# Patient Record
Sex: Male | Born: 1935 | Race: White | Hispanic: No | Smoking: Former smoker
Health system: Southern US, Community
[De-identification: ages and names within clinical notes are randomized; demographics above are authoritative.]

## PROBLEM LIST (undated history)

## (undated) DIAGNOSIS — I219 Acute myocardial infarction, unspecified: Secondary | ICD-10-CM

## (undated) DIAGNOSIS — I4891 Unspecified atrial fibrillation: Secondary | ICD-10-CM

## (undated) DIAGNOSIS — N183 Chronic kidney disease, stage 3 (moderate): Secondary | ICD-10-CM

## (undated) DIAGNOSIS — K224 Dyskinesia of esophagus: Secondary | ICD-10-CM

## (undated) DIAGNOSIS — I2510 Atherosclerotic heart disease of native coronary artery without angina pectoris: Secondary | ICD-10-CM

## (undated) DIAGNOSIS — I499 Cardiac arrhythmia, unspecified: Secondary | ICD-10-CM

## (undated) DIAGNOSIS — J449 Chronic obstructive pulmonary disease, unspecified: Secondary | ICD-10-CM

## (undated) DIAGNOSIS — C801 Malignant (primary) neoplasm, unspecified: Secondary | ICD-10-CM

## (undated) DIAGNOSIS — L600 Ingrowing nail: Secondary | ICD-10-CM

## (undated) DIAGNOSIS — R10A0 Flank pain, unspecified side: Secondary | ICD-10-CM

## (undated) DIAGNOSIS — N1831 Chronic kidney disease, stage 3a: Secondary | ICD-10-CM

## (undated) DIAGNOSIS — R32 Unspecified urinary incontinence: Secondary | ICD-10-CM

## (undated) DIAGNOSIS — I6529 Occlusion and stenosis of unspecified carotid artery: Secondary | ICD-10-CM

## (undated) DIAGNOSIS — I495 Sick sinus syndrome: Secondary | ICD-10-CM

## (undated) DIAGNOSIS — I358 Other nonrheumatic aortic valve disorders: Secondary | ICD-10-CM

## (undated) DIAGNOSIS — E7800 Pure hypercholesterolemia, unspecified: Secondary | ICD-10-CM

## (undated) DIAGNOSIS — R109 Unspecified abdominal pain: Secondary | ICD-10-CM

## (undated) DIAGNOSIS — R1310 Dysphagia, unspecified: Secondary | ICD-10-CM

## (undated) DIAGNOSIS — I10 Essential (primary) hypertension: Secondary | ICD-10-CM

## (undated) DIAGNOSIS — K635 Polyp of colon: Secondary | ICD-10-CM

## (undated) DIAGNOSIS — R413 Other amnesia: Secondary | ICD-10-CM

## (undated) HISTORY — DX: Other amnesia: R41.3

## (undated) HISTORY — DX: Chronic kidney disease, stage 3 (moderate): N18.3

## (undated) HISTORY — DX: Chronic kidney disease, stage 3a: N18.31

## (undated) HISTORY — DX: Other nonrheumatic aortic valve disorders: I35.8

## (undated) HISTORY — DX: Polyp of colon: K63.5

## (undated) HISTORY — DX: Ingrowing nail: L60.0

## (undated) HISTORY — DX: Unspecified abdominal pain: R10.9

## (undated) HISTORY — DX: Dyskinesia of esophagus: K22.4

## (undated) HISTORY — DX: Atherosclerotic heart disease of native coronary artery without angina pectoris: I25.10

## (undated) HISTORY — PX: CARDIAC SURGERY: SHX584

## (undated) HISTORY — PX: INGUINAL HERNIA REPAIR: SUR1180

## (undated) HISTORY — DX: Sick sinus syndrome: I49.5

## (undated) HISTORY — DX: Occlusion and stenosis of unspecified carotid artery: I65.29

## (undated) HISTORY — DX: Dysphagia, unspecified: R13.10

## (undated) HISTORY — DX: Unspecified urinary incontinence: R32

## (undated) HISTORY — DX: Flank pain, unspecified side: R10.A0

## (undated) HISTORY — PX: CORONARY ARTERY BYPASS GRAFT: SHX141

## (undated) HISTORY — PX: PTCA: SHX146

## (undated) HISTORY — PX: APPENDECTOMY: SHX54

## (undated) HISTORY — DX: Cardiac arrhythmia, unspecified: I49.9

## (undated) HISTORY — DX: Unspecified atrial fibrillation: I48.91

## (undated) HISTORY — PX: OTHER SURGICAL HISTORY: SHX169

---
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
## 1996-11-30 HISTORY — PX: REFRACTIVE SURGERY: SHX103

## 1996-12-31 HISTORY — PX: PACEMAKER INSERTION: SHX728

## 1998-09-19 ENCOUNTER — Encounter: Payer: Self-pay | Admitting: Gastroenterology

## 1998-09-19 ENCOUNTER — Ambulatory Visit (HOSPITAL_COMMUNITY): Admission: RE | Admit: 1998-09-19 | Discharge: 1998-09-19 | Payer: Self-pay | Admitting: Gastroenterology

## 2001-05-13 ENCOUNTER — Encounter (INDEPENDENT_AMBULATORY_CARE_PROVIDER_SITE_OTHER): Payer: Self-pay | Admitting: Specialist

## 2001-05-13 ENCOUNTER — Ambulatory Visit (HOSPITAL_COMMUNITY): Admission: RE | Admit: 2001-05-13 | Discharge: 2001-05-13 | Payer: Self-pay | Admitting: Gastroenterology

## 2002-05-11 ENCOUNTER — Ambulatory Visit (HOSPITAL_COMMUNITY): Admission: RE | Admit: 2002-05-11 | Discharge: 2002-05-11 | Payer: Self-pay | Admitting: Ophthalmology

## 2003-03-26 ENCOUNTER — Ambulatory Visit (HOSPITAL_COMMUNITY): Admission: RE | Admit: 2003-03-26 | Discharge: 2003-03-26 | Payer: Self-pay | Admitting: Internal Medicine

## 2003-10-25 ENCOUNTER — Emergency Department (HOSPITAL_COMMUNITY): Admission: EM | Admit: 2003-10-25 | Discharge: 2003-10-25 | Payer: Self-pay | Admitting: Emergency Medicine

## 2005-03-25 ENCOUNTER — Ambulatory Visit (HOSPITAL_COMMUNITY): Admission: RE | Admit: 2005-03-25 | Discharge: 2005-03-25 | Payer: Self-pay | Admitting: Internal Medicine

## 2005-08-09 ENCOUNTER — Emergency Department (HOSPITAL_COMMUNITY): Admission: EM | Admit: 2005-08-09 | Discharge: 2005-08-09 | Payer: Self-pay | Admitting: Emergency Medicine

## 2006-01-22 ENCOUNTER — Encounter: Admission: RE | Admit: 2006-01-22 | Discharge: 2006-01-22 | Payer: Self-pay | Admitting: Internal Medicine

## 2006-05-20 ENCOUNTER — Ambulatory Visit (HOSPITAL_COMMUNITY): Admission: RE | Admit: 2006-05-20 | Discharge: 2006-05-20 | Payer: Self-pay | Admitting: Internal Medicine

## 2006-05-20 ENCOUNTER — Encounter: Payer: Self-pay | Admitting: Vascular Surgery

## 2007-02-23 ENCOUNTER — Observation Stay (HOSPITAL_COMMUNITY): Admission: EM | Admit: 2007-02-23 | Discharge: 2007-02-24 | Payer: Self-pay | Admitting: Emergency Medicine

## 2007-02-24 HISTORY — PX: PACEMAKER GENERATOR CHANGE: SHX5998

## 2007-11-02 ENCOUNTER — Ambulatory Visit: Payer: Self-pay | Admitting: Surgery

## 2007-11-02 ENCOUNTER — Encounter (INDEPENDENT_AMBULATORY_CARE_PROVIDER_SITE_OTHER): Payer: Self-pay | Admitting: Internal Medicine

## 2007-11-02 ENCOUNTER — Ambulatory Visit (HOSPITAL_COMMUNITY): Admission: RE | Admit: 2007-11-02 | Discharge: 2007-11-02 | Payer: Self-pay | Admitting: Internal Medicine

## 2008-12-02 ENCOUNTER — Emergency Department (HOSPITAL_COMMUNITY): Admission: EM | Admit: 2008-12-02 | Discharge: 2008-12-02 | Payer: Self-pay | Admitting: Emergency Medicine

## 2009-11-30 DIAGNOSIS — L600 Ingrowing nail: Secondary | ICD-10-CM

## 2009-11-30 HISTORY — DX: Ingrowing nail: L60.0

## 2010-09-10 ENCOUNTER — Emergency Department (HOSPITAL_COMMUNITY): Admission: EM | Admit: 2010-09-10 | Discharge: 2010-09-10 | Payer: Self-pay | Admitting: Emergency Medicine

## 2011-02-04 ENCOUNTER — Other Ambulatory Visit: Payer: Self-pay | Admitting: Gastroenterology

## 2011-02-04 ENCOUNTER — Other Ambulatory Visit (HOSPITAL_COMMUNITY): Payer: Self-pay | Admitting: Gastroenterology

## 2011-02-09 ENCOUNTER — Ambulatory Visit (HOSPITAL_COMMUNITY)
Admission: RE | Admit: 2011-02-09 | Discharge: 2011-02-09 | Disposition: A | Discharge status: Home / Self Care | Payer: Medicare PPO | Source: Ambulatory Visit | Attending: Gastroenterology | Admitting: Gastroenterology

## 2011-02-09 DIAGNOSIS — K449 Diaphragmatic hernia without obstruction or gangrene: Secondary | ICD-10-CM | POA: Insufficient documentation

## 2011-02-09 DIAGNOSIS — R1310 Dysphagia, unspecified: Secondary | ICD-10-CM | POA: Insufficient documentation

## 2011-02-09 DIAGNOSIS — K219 Gastro-esophageal reflux disease without esophagitis: Secondary | ICD-10-CM | POA: Insufficient documentation

## 2011-02-12 LAB — COMPREHENSIVE METABOLIC PANEL
ALT: 18 U/L (ref 0–53)
AST: 23 U/L (ref 0–37)
Alkaline Phosphatase: 85 U/L (ref 39–117)
CO2: 27 mEq/L (ref 19–32)
GFR calc Af Amer: 54 mL/min — ABNORMAL LOW (ref >60)
GFR calc non Af Amer: 45 mL/min — ABNORMAL LOW (ref >60)
Glucose, Bld: 100 mg/dL — ABNORMAL HIGH (ref 70–99)
Potassium: 4.1 mEq/L (ref 3.5–5.1)
Sodium: 141 mEq/L (ref 135–145)

## 2011-02-12 LAB — CBC
HCT: 40.4 % (ref 39.0–52.0)
Hemoglobin: 13.5 g/dL (ref 13.0–17.0)
MCH: 30.4 pg (ref 26.0–34.0)
MCHC: 33.4 g/dL (ref 30.0–36.0)
MCV: 91 fL (ref 78.0–100.0)
RBC: 4.44 MIL/uL (ref 4.22–5.81)

## 2011-02-12 LAB — TROPONIN I
Troponin I: 0.01 ng/mL (ref 0.00–0.06)
Troponin I: 0.02 ng/mL (ref 0.00–0.06)

## 2011-02-12 LAB — CK TOTAL AND CKMB (NOT AT ARMC): CK, MB: 3 ng/mL (ref 0.3–4.0)

## 2011-02-26 ENCOUNTER — Ambulatory Visit (HOSPITAL_COMMUNITY)
Admission: RE | Admit: 2011-02-26 | Discharge: 2011-02-26 | Disposition: A | Discharge status: Home / Self Care | Payer: Medicare PPO | Source: Ambulatory Visit | Attending: Gastroenterology | Admitting: Gastroenterology

## 2011-02-26 DIAGNOSIS — K2289 Other specified disease of esophagus: Secondary | ICD-10-CM | POA: Insufficient documentation

## 2011-02-26 DIAGNOSIS — Z79899 Other long term (current) drug therapy: Secondary | ICD-10-CM | POA: Insufficient documentation

## 2011-02-26 DIAGNOSIS — R1310 Dysphagia, unspecified: Secondary | ICD-10-CM | POA: Insufficient documentation

## 2011-02-26 DIAGNOSIS — K228 Other specified diseases of esophagus: Secondary | ICD-10-CM | POA: Insufficient documentation

## 2011-02-26 DIAGNOSIS — K219 Gastro-esophageal reflux disease without esophagitis: Secondary | ICD-10-CM | POA: Insufficient documentation

## 2011-02-26 DIAGNOSIS — K449 Diaphragmatic hernia without obstruction or gangrene: Secondary | ICD-10-CM | POA: Insufficient documentation

## 2011-03-10 NOTE — Op Note (Signed)
  NAMEMarland [name]  KEIDRICK, MURTY NO.:  [id number]  MEDICAL RECORD NO.:  [id number]           PATIENT TYPE:  O  LOCATION:  WLEN                         FACILITY:  [facility]  PHYSICIAN:  Danise Edge, M.D.   DATE OF BIRTH:  Jan 02, 1917  DATE OF PROCEDURE:  02/26/2011 DATE OF DISCHARGE:                              OPERATIVE REPORT   PROCEDURE:  Esophagogastroduodenoscopy.  REFERRING PHYSICIAN:  Theressa Millard, M.D.  HISTORY:  Mr. John [name] is a 77 year old male born [birth year]. Mr. Kissoon has chronic gastroesophageal reflux manifested by heartburn. He is currently taking Nexium 40 mg each morning.  He reports breakthrough nocturnal heartburn.  The patient underwent a barium esophagram with barium tablet to evaluate his chronic intermittent solid food esophageal dysphagia without odynophagia.  Barium esophagram was normal except for gastroesophageal reflux.  ENDOSCOPIST:  Danise Edge, M.D.  PREMEDICATIONS: 1. Fentanyl 25 mcg. 2. Versed 3 mg.  DESCRIPTION OF PROCEDURE:  After obtaining informed consent, the patient was placed in the left lateral decubitus position.  The Pentax gastroscope was passed through the posterior hypopharynx into the proximal esophagus without difficulty.  The hypopharynx, larynx and vocal cords appeared normal.  ESOPHAGOSCOPY:  The proximal, mid, and lower segments of the esophageal mucosa appeared normal except for an isolated circular superficial erosion at the esophagogastric junction which was noted at approximately 40 cm from the incisor teeth.  There was no endoscopic evidence for the presence of Barrett's esophagus, esophageal stricture formation, or esophageal obstruction.  GASTROSCOPY:  There is a moderate-sized hiatal hernia.  Retroflex view of the gastric cardia and fundus was normal.  The diaphragmatic hiatus was quite patulous.  The gastric body, antrum and pylorus appeared normal.  DUODENOSCOPY:  The duodenal bulb  and descending duodenum appeared normal.  ASSESSMENT:  Chronic gastroesophageal reflux associated with breakthrough nocturnal heartburn associated with a moderate-sized hiatal hernia and an isolated erosion at the esophagogastric junction but no evidence of esophageal stricture formation that would warrant esophageal dilation.  RECOMMENDATIONS:  I will switch the patient from Nexium 40 mg each morning to omeprazole 20 mg before breakfast and 20 mg before supper.  I suspect Mr. Herendeen has esophageal dysmotility associated with his uncontrolled gastroesophageal reflux.          ______________________________ Danise Edge, M.D.     MJ/MEDQ  D:  02/26/2011  T:  02/26/2011  Job:  [id number]  cc:   Theressa Millard, M.D. Fax: [phone]  Electronically Signed by Danise Edge M.D. on 03/10/2011 12:13:45 PM

## 2011-03-16 LAB — POCT I-STAT, CHEM 8
BUN: 19 mg/dL (ref 6–23)
Calcium, Ion: 1.18 mmol/L (ref 1.12–1.32)
Creatinine, Ser: 1.3 mg/dL (ref 0.4–1.5)
Hemoglobin: 15 g/dL (ref 13.0–17.0)
TCO2: 24 mmol/L (ref 0–100)

## 2011-03-16 LAB — PROTIME-INR: Prothrombin Time: 12.9 seconds (ref 11.6–15.2)

## 2011-03-16 LAB — T4, FREE: Free T4: 1.24 ng/dL (ref 0.89–1.80)

## 2011-03-16 LAB — MAGNESIUM: Magnesium: 2.2 mg/dL (ref 1.5–2.5)

## 2011-04-17 NOTE — Op Note (Signed)
NAME:  John, [name] NO.:  [id number]   MEDICAL RECORD NO.:  [id number]          PATIENT TYPE:  INP   LOCATION:  6529                         FACILITY:  [facility]   PHYSICIAN:  Elmore Guise., M.D.DATE OF BIRTH:  Dec 30, 1916   DATE OF PROCEDURE:  02/24/2007  DATE OF DISCHARGE:                               OPERATIVE REPORT   INDICATION FOR PROCEDURE:  Elective change out for generator (battery at  elective replacement interval)   PROCEDURE DESCRIPTION:  The patient brought to the cardiac cath lab  after appropriate informed consent.  He was prepped and draped in  sterile fashion.  Approximately 30 mL of 1% lidocaine was used for local  anesthesia.  A 2 inch incision was made over the left deltopectoral  region over the old retained generator.  The generator was then exposed  and removed from the pocket.  The old device was disconnected from the  leads.  The atrial lead is a Medtronic [id number] - 52 cm, serial number  [id number] implanted January 09, 1997. The following measurements were  obtained.  P-waves measured 6.8 mV with impedance 542 ohms, threshold  0.6 volts at 0.5 milliseconds, current was 1.6 mA.  The ventricular lead  was then checked. Ventricular lead is a Medtronic [id number] 58 cm, serial  number [id number] implanted to January 09, 1997. Following measurements  were obtained.  R waves measured 22.9 mV, impedance of 708 ohms,  threshold 0.2 volts at 0.5 milliseconds, a current of 0.3 mA. The atrial  and ventricular leads were then identified. The pocket was then  copiously irrigated with Kanamycin solution.  Pocket revision was then  done on the inferior border to allow appropriate position of the new  generator.  The new generator was then placed in the pocket and sewn  into place. Pocket was then closed in three continuous layers with 2-0  followed by 2-0 followed by 4-0 Vicryl suture.  The patient tolerated  the procedure well with no apparent  complications.      Elmore Guise., M.D.  Electronically Signed     TWK/MEDQ  D:  02/24/2007  T:  02/24/2007  Job:  [id number]   cc:   Lyn Records, M.D.

## 2011-04-17 NOTE — Discharge Summary (Signed)
John [name], MESSLER NO.:  [id number]   MEDICAL RECORD NO.:  [id number]          PATIENT TYPE:  INP   LOCATION:  6529                         FACILITY:  [facility]   PHYSICIAN:  Guy Franco, P.A.       DATE OF BIRTH:  Apr 06, 1917   DATE OF ADMISSION:  02/23/2007  DATE OF DISCHARGE:  02/24/2007                               DISCHARGE SUMMARY   DISCHARGE DIAGNOSES:  1. Dizziness, resolved.  2. History of sick sinus syndrome.  Initially pacemaker placement in      1998.  Last interrogation in the office in February of 2008.      Showed ERI, battery change-out during this admission.  3. Right bundle-branch block.  4. Coronary artery disease.  History of coronary artery bypass      grafting in 1994 by Dr. Irving Shows.  5. Chronic obstructive pulmonary disease.  6. Hypertension.  7. Aortic valve sclerosis, systolic murmur.  8. Hyperlipidemia.  9. Long-term medication use.   Mr. Chalker is an 77 year old male patient who presented to [facility] with dizziness.  He had known ERI with his current pacemaker.  Was scheduled for a change-out in the near future.  Because of symptoms  and the fact that we knew that his device needed changing out, we went  ahead and had that performed.  The change-out was performed by Dr. Lady Deutscher on February 24, 2007.  The procedure was performed and went well.  The device atrial lead is a Medtronic lead, and the ventricular lead is  a Medtronic lead as well.  The procedure was performed without  difficulty.  The patient was discharged to home later that day, without  problem.   Lab studies during the patient's hospital stay include a white count of  8.7, hemoglobin 13.1, hematocrit 38.5, platelets 175, sodium 141,  potassium 4.1, BUN 11, creatinine 1.11.  CK-MB and troponins are  negative.  TSH 3.374.   Please note that the patient did have a tooth that was questionably  abscessed.  He had an appointment to see his dentist  apparently on the  day of his battery change-out.  I have discussed this with Dr. Lady Deutscher, and we opted to proceed with the change-out.  Antibiotics were  given during this procedure.   He was instructed to follow up with his dentist the following day.   DISCHARGE MEDICATIONS:  1. Amoxicillin 500 mg 1 p.o. b.i.d. for his tooth.  2. He is to continue aspirin 325 mg a day.  3. Caduet 5/10 one tablet daily.  4. Detrol 4 mg a day.  5. Digoxin daily, as prior to admission.  6. Toprol 50 mg a day.  7. Nexium 40 mg a day.  8. Spiriva daily.  9. Sublingual nitroglycerin p.r.n. chest pain.  10.Darvocet p.r.n.   Again, see his dentist tomorrow.  Follow up with Dr. Amil Amen as  scheduled, next week.  Increase activity and perform wound care as per  instruction sheet that was given to the patient.  Remain on the low-  sodium heart-healthy diet.  Guy Franco, P.A.     LB/MEDQ  D:  04/29/2007  T:  04/30/2007  Job:  [id number]   cc:   Elmore Guise., M.D.  Lyn Records, M.D.  Theressa Millard, M.D.

## 2013-02-03 ENCOUNTER — Encounter (HOSPITAL_COMMUNITY): Payer: Self-pay | Admitting: Emergency Medicine

## 2013-02-03 ENCOUNTER — Emergency Department (HOSPITAL_COMMUNITY)
Admission: EM | Admit: 2013-02-03 | Discharge: 2013-02-03 | Disposition: A | Discharge status: Home / Self Care | Payer: Medicare Other | Attending: Emergency Medicine | Admitting: Emergency Medicine

## 2013-02-03 ENCOUNTER — Emergency Department (HOSPITAL_COMMUNITY): Payer: Medicare Other

## 2013-02-03 DIAGNOSIS — Z862 Personal history of diseases of the blood and blood-forming organs and certain disorders involving the immune mechanism: Secondary | ICD-10-CM | POA: Insufficient documentation

## 2013-02-03 DIAGNOSIS — Z8639 Personal history of other endocrine, nutritional and metabolic disease: Secondary | ICD-10-CM | POA: Insufficient documentation

## 2013-02-03 DIAGNOSIS — R4182 Altered mental status, unspecified: Secondary | ICD-10-CM | POA: Insufficient documentation

## 2013-02-03 DIAGNOSIS — I10 Essential (primary) hypertension: Secondary | ICD-10-CM | POA: Insufficient documentation

## 2013-02-03 DIAGNOSIS — N390 Urinary tract infection, site not specified: Secondary | ICD-10-CM | POA: Insufficient documentation

## 2013-02-03 DIAGNOSIS — I252 Old myocardial infarction: Secondary | ICD-10-CM | POA: Insufficient documentation

## 2013-02-03 DIAGNOSIS — I2510 Atherosclerotic heart disease of native coronary artery without angina pectoris: Secondary | ICD-10-CM | POA: Insufficient documentation

## 2013-02-03 DIAGNOSIS — J4489 Other specified chronic obstructive pulmonary disease: Secondary | ICD-10-CM | POA: Insufficient documentation

## 2013-02-03 DIAGNOSIS — Z8546 Personal history of malignant neoplasm of prostate: Secondary | ICD-10-CM | POA: Insufficient documentation

## 2013-02-03 DIAGNOSIS — F0390 Unspecified dementia without behavioral disturbance: Secondary | ICD-10-CM | POA: Insufficient documentation

## 2013-02-03 HISTORY — DX: Pure hypercholesterolemia, unspecified: E78.00

## 2013-02-03 HISTORY — DX: Atherosclerotic heart disease of native coronary artery without angina pectoris: I25.10

## 2013-02-03 HISTORY — DX: Chronic obstructive pulmonary disease, unspecified: J44.9

## 2013-02-03 HISTORY — DX: Malignant (primary) neoplasm, unspecified: C80.1

## 2013-02-03 HISTORY — DX: Essential (primary) hypertension: I10

## 2013-02-03 HISTORY — DX: Acute myocardial infarction, unspecified: I21.9

## 2013-02-03 LAB — URINALYSIS, ROUTINE W REFLEX MICROSCOPIC
Glucose, UA: NEGATIVE mg/dL
Hgb urine dipstick: NEGATIVE
Ketones, ur: NEGATIVE mg/dL
pH: 6 (ref 5.0–8.0)

## 2013-02-03 LAB — CBC WITH DIFFERENTIAL/PLATELET
Eosinophils Absolute: 0.1 10*3/uL (ref 0.0–0.7)
Eosinophils Relative: 1 % (ref 0–5)
HCT: 37.1 % — ABNORMAL LOW (ref 39.0–52.0)
Hemoglobin: 12.5 g/dL — ABNORMAL LOW (ref 13.0–17.0)
Lymphs Abs: 1.4 10*3/uL (ref 0.7–4.0)
MCH: 30.3 pg (ref 26.0–34.0)
MCV: 89.8 fL (ref 78.0–100.0)
Monocytes Absolute: 0.5 10*3/uL (ref 0.1–1.0)
Monocytes Relative: 7 % (ref 3–12)
Neutrophils Relative %: 73 % (ref 43–77)
RBC: 4.13 MIL/uL — ABNORMAL LOW (ref 4.22–5.81)

## 2013-02-03 LAB — BASIC METABOLIC PANEL
BUN: 15 mg/dL (ref 6–23)
CO2: 24 mEq/L (ref 19–32)
Chloride: 106 mEq/L (ref 96–112)
Glucose, Bld: 92 mg/dL (ref 70–99)
Potassium: 4.3 mEq/L (ref 3.5–5.1)
Sodium: 140 mEq/L (ref 135–145)

## 2013-02-03 LAB — URINE MICROSCOPIC-ADD ON

## 2013-02-03 MED ORDER — CIPROFLOXACIN HCL 250 MG PO TABS
500.0000 mg | ORAL_TABLET | Freq: Once | ORAL | Status: AC
Start: 2013-02-03 — End: 2013-02-03
  Administered 2013-02-03: 500 mg via ORAL
  Filled 2013-02-03: qty 2

## 2013-02-03 MED ORDER — CIPROFLOXACIN HCL 500 MG PO TABS: 500.0000 mg | ORAL_TABLET | Freq: Two times a day (BID) | ORAL | Status: DC

## 2013-02-03 NOTE — ED Notes (Signed)
Left in c/o family for transport home; instructions given and reviewed with family; f/u information discussed - family verbalizes understanding.

## 2013-02-03 NOTE — ED Provider Notes (Signed)
History     CSN: [id number]  Arrival date & time 02/03/13  0929   First MD Initiated Contact with Patient 02/03/13 [phone]      Chief Complaint  Patient presents with  . Altered Mental Status    (Consider location/radiation/quality/duration/timing/severity/associated sxs/prior treatment) HPI...level V caveat for dementia.  Family reports increasing confusion over the past couple weeks. He makes references to his wife being in bed with him. She has been deceased for several years.  He is making references to other people in his home. He is getting his pills mixed up.  Has not been ill lately. Nothing makes symptoms better or worse. Severity is moderate.  Past Medical History  Diagnosis Date  . COPD (chronic obstructive pulmonary disease)   . Coronary artery disease   . Hypertension   . High cholesterol   . MI (myocardial infarction)   . Cancer     prostate    Past Surgical History  Procedure Laterality Date  . Cardiac surgery      No family history on file.  History  Substance Use Topics  . Smoking status: Former Games developer  . Smokeless tobacco: Not on file  . Alcohol Use: No      Review of Systems  Unable to perform ROS: Dementia    Allergies  Review of patient's allergies indicates no known allergies.  Home Medications  No current outpatient prescriptions on file.  BP 154/58  Pulse 78  Temp(Src) 98.4 F (36.9 C)  Resp 18  Ht 5' 11.5" (1.816 m)  Wt 179 lb (81.194 kg)  BMI 24.62 kg/m2  SpO2 98%  Physical Exam  Nursing note and vitals reviewed. Constitutional: He is oriented to person, place, and time. He appears well-developed and well-nourished.  HENT:  Head: Normocephalic and atraumatic.  Eyes: Conjunctivae and EOM are normal. Pupils are equal, round, and reactive to light.  Neck: Normal range of motion. Neck supple.  Cardiovascular: Normal rate, regular rhythm and normal heart sounds.   Pulmonary/Chest: Effort normal and breath sounds normal.   Abdominal: Soft. Bowel sounds are normal.  Musculoskeletal: Normal range of motion.  Neurological: He is alert and oriented to person, place, and time.  Skin: Skin is warm and dry.  Psychiatric: He has a normal mood and affect.    ED Course  Procedures (including critical care time)  Labs Reviewed  BASIC METABOLIC PANEL - Abnormal; Notable for the following:    GFR calc non Af Amer 50 (*)    GFR calc Af Amer 58 (*)    All other components within normal limits  CBC WITH DIFFERENTIAL - Abnormal; Notable for the following:    RBC 4.13 (*)    Hemoglobin 12.5 (*)    HCT 37.1 (*)    Platelets 146 (*)    All other components within normal limits  URINALYSIS, ROUTINE W REFLEX MICROSCOPIC - Abnormal; Notable for the following:    Leukocytes, UA TRACE (*)    All other components within normal limits  URINE MICROSCOPIC-ADD ON - Abnormal; Notable for the following:    Casts GRANULAR CAST (*)    All other components within normal limits  URINE CULTURE  TROPONIN I   No results found.   No diagnosis found.  Date: 02/03/2013  Rate: 76  Rhythm: paced  QRS Axis: normal  Intervals: normal  ST/T Wave abnormalities: normal  Conduction Disutrbances:right bundle branch block  Narrative Interpretation:   Old EKG Reviewed: changes noted    MDM  Patient has  a minor urinary tract infection. Rx Cipro for same. Otherwise, I suspect patient has the beginnings of dementia.  This was discussed with the family. They understand         Donnetta Hutching, MD 02/03/13 1406

## 2013-02-03 NOTE — ED Notes (Addendum)
Pt lives at home alone, alert and oriented  and cares for self at baseline. Pt family/friend reports pt has been increasingly confused x 2 week and has had some hallucinations. Pt states his wife was in the bed with him this morning, but she died 6 years ago. Pt alert and oriented x 4. Pt and friend deny any changes in speech/ambulation/vision. Pt ambulated to stretcher from wheelchair independently.  nad noted.

## 2013-02-07 LAB — URINE CULTURE: Colony Count: >=100000

## 2013-02-08 NOTE — ED Notes (Signed)
+   Urine Patient treated with cipro-sensitive to same-chart appended per protocol MD. 

## 2013-06-22 ENCOUNTER — Emergency Department (HOSPITAL_COMMUNITY): Payer: Medicare Other

## 2013-06-22 ENCOUNTER — Encounter (HOSPITAL_COMMUNITY): Payer: Self-pay | Admitting: Neurology

## 2013-06-22 ENCOUNTER — Emergency Department (HOSPITAL_COMMUNITY)
Admission: EM | Admit: 2013-06-22 | Discharge: 2013-06-22 | Disposition: A | Discharge status: Home / Self Care | Payer: Medicare Other | Attending: Emergency Medicine | Admitting: Emergency Medicine

## 2013-06-22 DIAGNOSIS — Z8546 Personal history of malignant neoplasm of prostate: Secondary | ICD-10-CM | POA: Insufficient documentation

## 2013-06-22 DIAGNOSIS — Z79899 Other long term (current) drug therapy: Secondary | ICD-10-CM | POA: Insufficient documentation

## 2013-06-22 DIAGNOSIS — IMO0002 Reserved for concepts with insufficient information to code with codable children: Secondary | ICD-10-CM | POA: Insufficient documentation

## 2013-06-22 DIAGNOSIS — J4489 Other specified chronic obstructive pulmonary disease: Secondary | ICD-10-CM | POA: Insufficient documentation

## 2013-06-22 DIAGNOSIS — E785 Hyperlipidemia, unspecified: Secondary | ICD-10-CM

## 2013-06-22 DIAGNOSIS — I2510 Atherosclerotic heart disease of native coronary artery without angina pectoris: Secondary | ICD-10-CM | POA: Insufficient documentation

## 2013-06-22 DIAGNOSIS — R0989 Other specified symptoms and signs involving the circulatory and respiratory systems: Secondary | ICD-10-CM | POA: Insufficient documentation

## 2013-06-22 DIAGNOSIS — I252 Old myocardial infarction: Secondary | ICD-10-CM | POA: Insufficient documentation

## 2013-06-22 DIAGNOSIS — J449 Chronic obstructive pulmonary disease, unspecified: Secondary | ICD-10-CM | POA: Insufficient documentation

## 2013-06-22 DIAGNOSIS — I739 Peripheral vascular disease, unspecified: Secondary | ICD-10-CM | POA: Insufficient documentation

## 2013-06-22 DIAGNOSIS — R0600 Dyspnea, unspecified: Secondary | ICD-10-CM

## 2013-06-22 DIAGNOSIS — R0609 Other forms of dyspnea: Secondary | ICD-10-CM | POA: Insufficient documentation

## 2013-06-22 DIAGNOSIS — Z7982 Long term (current) use of aspirin: Secondary | ICD-10-CM | POA: Insufficient documentation

## 2013-06-22 DIAGNOSIS — Z951 Presence of aortocoronary bypass graft: Secondary | ICD-10-CM | POA: Insufficient documentation

## 2013-06-22 DIAGNOSIS — R0602 Shortness of breath: Secondary | ICD-10-CM | POA: Insufficient documentation

## 2013-06-22 DIAGNOSIS — R609 Edema, unspecified: Secondary | ICD-10-CM | POA: Insufficient documentation

## 2013-06-22 DIAGNOSIS — I10 Essential (primary) hypertension: Secondary | ICD-10-CM | POA: Insufficient documentation

## 2013-06-22 LAB — CBC WITH DIFFERENTIAL/PLATELET
Basophils Relative: 0 % (ref 0–1)
Eosinophils Absolute: 0.1 10*3/uL (ref 0.0–0.7)
Eosinophils Relative: 1 % (ref 0–5)
HCT: 39.5 % (ref 39.0–52.0)
Hemoglobin: 13.1 g/dL (ref 13.0–17.0)
MCH: 29.6 pg (ref 26.0–34.0)
MCHC: 33.2 g/dL (ref 30.0–36.0)
Monocytes Absolute: 0.6 10*3/uL (ref 0.1–1.0)
Monocytes Relative: 9 % (ref 3–12)

## 2013-06-22 LAB — COMPREHENSIVE METABOLIC PANEL
Albumin: 3.6 g/dL (ref 3.5–5.2)
BUN: 17 mg/dL (ref 6–23)
Creatinine, Ser: 1.29 mg/dL (ref 0.50–1.35)
Total Bilirubin: 0.3 mg/dL (ref 0.3–1.2)
Total Protein: 6.4 g/dL (ref 6.0–8.3)

## 2013-06-22 LAB — URINALYSIS, ROUTINE W REFLEX MICROSCOPIC
Glucose, UA: NEGATIVE mg/dL
Leukocytes, UA: NEGATIVE
Protein, ur: NEGATIVE mg/dL
pH: 6 (ref 5.0–8.0)

## 2013-06-22 LAB — PRO B NATRIURETIC PEPTIDE: Pro B Natriuretic peptide (BNP): 297.2 pg/mL (ref 0–450)

## 2013-06-22 LAB — POCT I-STAT TROPONIN I

## 2013-06-22 NOTE — Consult Note (Signed)
[facility] History and Physical  John [name] ZOX:[id number] DOB: Oct 14, 1917    PCP:   Darnelle Bos, MD   Chief Complaint: leg pain with walking.  HPI: John [name] is an 79 y.o. male with hx of COPD, CAD, HTN, hyperlipidemia, brought into the ER as he has pain with ambulation.  He can walk 50 feet with right leg pain, requiring 10 minutes of rest.  This has not been a change in pattern.  He has no rest pain.   There has been no fever, chills, or leg swelling.  Hospitalist was asked to consult for admission.  Evaluation in the ER included normal serology.  Rewiew of Systems:  Constitutional: Negative for malaise, fever and chills. No significant weight loss or weight gain Eyes: Negative for eye pain, redness and discharge, diplopia, visual changes, or flashes of light. ENMT: Negative for ear pain, hoarseness, nasal congestion, sinus pressure and sore throat. No headaches; tinnitus, drooling, or problem swallowing. Cardiovascular: Negative for chest pain, palpitations, diaphoresis, dyspnea and peripheral edema. ; No orthopnea, PND Respiratory: Negative for cough, hemoptysis, wheezing and stridor. No pleuritic chestpain. Gastrointestinal: Negative for nausea, vomiting, diarrhea, constipation, abdominal pain, melena, blood in stool, hematemesis, jaundice and rectal bleeding.    Genitourinary: Negative for frequency, dysuria, incontinence,flank pain and hematuria; Musculoskeletal: Negative for back pain and neck pain. Negative for swelling and trauma.;  Skin: . Negative for pruritus, rash, abrasions, bruising and skin lesion.; ulcerations Neuro: Negative for headache, lightheadedness and neck stiffness. Negative for weakness, altered level of consciousness , altered mental status, extremity weakness, burning feet, involuntary movement, seizure and syncope.  Psych: negative for anxiety, depression, insomnia, tearfulness, panic attacks, hallucinations, paranoia, suicidal or  homicidal ideation   Past Medical History  Diagnosis Date  . COPD (chronic obstructive pulmonary disease)   . Coronary artery disease   . Hypertension   . High cholesterol   . MI (myocardial infarction)   . Cancer     prostate    Past Surgical History  Procedure Laterality Date  . Cardiac surgery      Medications:  HOME MEDS: Prior to Admission medications   Medication Sig Start Date End Date Taking? Authorizing Provider  amLODipine (NORVASC) 5 MG tablet Take 5 mg by mouth daily.   Yes Historical Provider, MD  aspirin 325 MG EC tablet Take 325 mg by mouth daily.   Yes Historical Provider, MD  atorvastatin (LIPITOR) 10 MG tablet Take 10 mg by mouth every evening.   Yes Historical Provider, MD  beclomethasone (QVAR) 40 MCG/ACT inhaler Inhale 2 puffs into the lungs 2 (two) times daily.   Yes Historical Provider, MD  Cyanocobalamin 1000 MCG/ML KIT Inject 1 vial as directed every 30 (thirty) days.   Yes Historical Provider, MD  docusate sodium (COLACE) 100 MG capsule Take 100 mg by mouth daily.   Yes Historical Provider, MD  esomeprazole (NEXIUM) 40 MG capsule Take 40 mg by mouth daily before breakfast.   Yes Historical Provider, MD  Memantine HCl ER (NAMENDA XR) 28 MG CP24 Take 28 mg by mouth every morning.    Yes Historical Provider, MD  metoprolol tartrate (LOPRESSOR) 25 MG tablet Take 25 mg by mouth 2 (two) times daily.   Yes Historical Provider, MD  Multiple Vitamins-Minerals (PRESERVISION AREDS 2 PO) Take 1 tablet by mouth 2 (two) times daily.   Yes Historical Provider, MD  polyethylene glycol (MIRALAX / GLYCOLAX) packet Take 17 g by mouth daily.   Yes Historical Provider, MD  tiotropium (SPIRIVA) 18 MCG inhalation capsule Place 18 mcg into inhaler and inhale daily.   Yes Historical Provider, MD     Allergies:  Allergies  Allergen Reactions  . Albuterol Palpitations    Social History:   reports that he has quit smoking. He does not have any smokeless tobacco history on  file. He reports that he does not drink alcohol or use illicit drugs.  Family History: No family history on file.   Physical Exam: Filed Vitals:   06/22/13 1649 06/22/13 2045 06/22/13 2100  BP: 141/55 183/61 148/58  Pulse: 68 75 66  Temp: 98.1 F (36.7 C)    TempSrc: Oral    Resp: 16 19 17   SpO2: 97% 99% 96%   Blood pressure 148/58, pulse 66, temperature 98.1 F (36.7 C), temperature source Oral, resp. rate 17, SpO2 96.00%.  GEN:  Pleasant  patient lying in the stretcher in no acute distress; cooperative with exam. PSYCH:  alert and oriented x4; does not appear anxious or depressed; affect is appropriate. HEENT: Mucous membranes pink and anicteric; PERRLA; EOM intact; no cervical lymphadenopathy nor thyromegaly or carotid bruit; no JVD; There were no stridor. Neck is very supple. Breasts:: Not examined CHEST WALL: No tenderness CHEST: Normal respiration, clear to auscultation bilaterally.  HEART: Regular rate and rhythm.  There are no murmur, rub, or gallops.   BACK: No kyphosis or scoliosis; no CVA tenderness ABDOMEN: soft and non-tender; no masses, no organomegaly, normal abdominal bowel sounds; no pannus; no intertriginous candida. There is no rebound and no distention. Rectal Exam: Not done EXTREMITIES: Both feet are warm, and no ischemic sign.  Pulses are palpable distally. Genitalia: not examined PULSES: 2+ and symmetric SKIN: Normal hydration no rash or ulceration CNS: Cranial nerves 2-12 grossly intact no focal lateralizing neurologic deficit.  Speech is fluent; uvula elevated with phonation, facial symmetry and tongue midline. DTR are normal bilaterally, cerebella exam is intact, barbinski is negative and strengths are equaled bilaterally.  No sensory loss.   Labs on Admission:  Basic Metabolic Panel:  Recent Labs Lab 06/22/13 1744  NA 135  K 4.6  CL 100  CO2 26  GLUCOSE 92  BUN 17  CREATININE 1.29  CALCIUM 9.0   Liver Function Tests:  Recent Labs Lab  06/22/13 1744  AST 21  ALT 13  ALKPHOS 125*  BILITOT 0.3  PROT 6.4  ALBUMIN 3.6   No results found for this basename: LIPASE, AMYLASE,  in the last 168 hours No results found for this basename: AMMONIA,  in the last 168 hours CBC:  Recent Labs Lab 06/22/13 1744  WBC 6.9  NEUTROABS 4.6  HGB 13.1  HCT 39.5  MCV 89.2  PLT 165   Cardiac Enzymes: No results found for this basename: CKTOTAL, CKMB, CKMBINDEX, TROPONINI,  in the last 168 hours  CBG: No results found for this basename: GLUCAP,  in the last 168 hours   Radiological Exams on Admission: Dg Chest 2 View  06/22/2013   *RADIOLOGY REPORT*  Clinical Data: Weakness  CHEST - 2 VIEW  Comparison: September 10, 2010  Findings: There is a degree of underlying emphysema.  There is mild scarring in the left base.  The lungs are otherwise clear.  The heart size is normal.  The pulmonary vascularity reflects underlying emphysema.  No adenopathy.  The patient is status post coronary artery bypass grafting.  There are pacemaker leads attached to the right atrium right ventricle. There is atherosclerotic change in the aorta.  IMPRESSION:  Emphysema.  No edema or consolidation.   Original Report Authenticated By: Bretta Bang, M.D.   Dg Hip Complete Right  06/22/2013   *RADIOLOGY REPORT*  Clinical Data: Fall, weakness  RIGHT HIP - COMPLETE 2+ VIEW  Comparison: None.  Findings: Hips are located.  Dedicated view of the right hip demonstrates no femoral neck fracture. Vascular calcifications noted.  IMPRESSION: No evidence of pelvic fracture or right hip fracture.   Original Report Authenticated By: Genevive Bi, M.D.   Dg Femur Right  06/22/2013   *RADIOLOGY REPORT*  Clinical Data: The weakness.  Fall 3 weeks ago.  RIGHT FEMUR - 2 VIEW  Comparison: Right hip radiographs of the same day.  Findings: The right hip and knee are located.  No acute bone or soft tissue abnormalities are present.  There is no significant effusion.  Atherosclerotic  calcifications are present within the right femoral artery.  The patient is status post the treatment with reactive prostate seeds.  IMPRESSION:  1.  No acute abnormality. 2.  Atherosclerosis.   Original Report Authenticated By: Marin Roberts, M.D.   Dg Abd 1 View  06/22/2013   *RADIOLOGY REPORT*  Clinical Data: Weakness, fall  ABDOMEN - 1 VIEW  Comparison: None.  Findings: No dilated loops of large or small bowel.  No pathologic calcifications.  Brachytherapy seeds noted in the prostate bed.  IMPRESSION: No evidence of bowel obstruction.  No evidence of trauma.   Original Report Authenticated By: Genevive Bi, M.D.   Assessment/Plan  Stable PVD with 26ft claudication. Known CAD. HTN Hyperlipidemia.  PLAN:  I have offered to admit him for facilitation of work up, but he is also medically stable.  Family preferred to do outpatient work up.  I have asked EDP to consult vascular surgery over the phone so he will have proper follow up.  I would start him on an ASA a day if no contraindication.  I explained that if he has persistent pain, especially at rest, he needs to return to the ER as it would be a medical emergency.  Note he has no pain with no ambulation.  Thank you for asking me to participate in his care.  Other plans as per orders.  Code Status: FULL Unk Lightning, MD. [facility] Pager [phone] 7pm to 7am.  06/22/2013, 9:28 PM

## 2013-06-22 NOTE — ED Provider Notes (Signed)
CSN: [id number]     Arrival date & time 06/22/13  1639 History     First MD Initiated Contact with Patient 06/22/13 1640     Chief Complaint  Patient presents with  . Weakness   (Consider location/radiation/quality/duration/timing/severity/associated sxs/prior Treatment) HPI Comments: Pt w/ hx of COPD, CAD s/p 6v bypass and pacer, HTN and prostate CA now w/ weakness and DOE. States over past several wks increasing DOE, mild dyspnea at rest. No chest pain or cough. No fever. Admits to right thigh pain w/ exertion. Fell against refrigerator 3 wks  ago and notes pain ever since. Pain located right mid femur. States constipation x 5 wks, has been taking miralax w/out relief. Admits to nausea, no emesis, no abd pain, no urinary sx. Today woke up w/ worsening weakness and dyspnea and right leg pain so came to ED. States approx 63ft of ambulation before onset of pain in leg.  Patient is a 79 y.o. male presenting with general illness. The history is provided by the patient. No language interpreter was used.  Illness Location:  Musc, cardio/pulm Quality:  Right leg pain and dyspnea Severity:  Moderate Onset quality:  Gradual Timing:  Intermittent Progression:  Worsening Chronicity:  New Associated symptoms: myalgias and shortness of breath   Associated symptoms: no abdominal pain, no chest pain, no congestion, no cough, no diarrhea, no fever, no headaches, no nausea, no rash, no sore throat and no vomiting     Past Medical History  Diagnosis Date  . COPD (chronic obstructive pulmonary disease)   . Coronary artery disease   . Hypertension   . High cholesterol   . MI (myocardial infarction)   . Cancer     prostate   Past Surgical History  Procedure Laterality Date  . Cardiac surgery     No family history on file. History  Substance Use Topics  . Smoking status: Former Games developer  . Smokeless tobacco: Not on file  . Alcohol Use: No    Review of Systems  Constitutional: Negative for  fever and chills.  HENT: Negative for congestion and sore throat.   Respiratory: Positive for shortness of breath. Negative for cough.   Cardiovascular: Negative for chest pain and leg swelling.  Gastrointestinal: Negative for nausea, vomiting, abdominal pain, diarrhea and constipation.  Genitourinary: Negative for dysuria and frequency.  Musculoskeletal: Positive for myalgias.  Skin: Negative for color change and rash.  Neurological: Negative for dizziness and headaches.  Psychiatric/Behavioral: Negative for confusion and agitation.  All other systems reviewed and are negative.    Allergies  Albuterol  Home Medications   Current Outpatient Rx  Name  Route  Sig  Dispense  Refill  . amLODipine (NORVASC) 5 MG tablet   Oral   Take 5 mg by mouth daily.         Marland Kitchen aspirin 325 MG EC tablet   Oral   Take 325 mg by mouth daily.         Marland Kitchen atorvastatin (LIPITOR) 10 MG tablet   Oral   Take 10 mg by mouth every evening.         . beclomethasone (QVAR) 40 MCG/ACT inhaler   Inhalation   Inhale 2 puffs into the lungs 2 (two) times daily.         . Cyanocobalamin 1000 MCG/ML KIT   Injection   Inject 1 vial as directed every 30 (thirty) days.         Marland Kitchen docusate sodium (COLACE) 100 MG capsule  Oral   Take 100 mg by mouth daily.         Marland Kitchen esomeprazole (NEXIUM) 40 MG capsule   Oral   Take 40 mg by mouth daily before breakfast.         . Memantine HCl ER (NAMENDA XR) 28 MG CP24   Oral   Take 28 mg by mouth every morning.          . metoprolol tartrate (LOPRESSOR) 25 MG tablet   Oral   Take 25 mg by mouth 2 (two) times daily.         . Multiple Vitamins-Minerals (PRESERVISION AREDS 2 PO)   Oral   Take 1 tablet by mouth 2 (two) times daily.         . polyethylene glycol (MIRALAX / GLYCOLAX) packet   Oral   Take 17 g by mouth daily.         Marland Kitchen tiotropium (SPIRIVA) 18 MCG inhalation capsule   Inhalation   Place 18 mcg into inhaler and inhale daily.           BP 141/55  Pulse 68  Temp(Src) 98.1 F (36.7 C) (Oral)  Resp 16  SpO2 97% Physical Exam  Constitutional: He is oriented to person, place, and time. He appears well-developed and well-nourished. No distress.  HENT:  Head: Normocephalic and atraumatic.  Eyes: EOM are normal. Pupils are equal, round, and reactive to light.  Neck: Normal range of motion. Neck supple.  Cardiovascular: Normal rate and regular rhythm.   Pulmonary/Chest: Effort normal. No respiratory distress. He has decreased breath sounds in the left lower field.  Abdominal: Soft. He exhibits no distension.  Musculoskeletal: Normal range of motion. He exhibits no edema.       Right upper leg: He exhibits tenderness. He exhibits no bony tenderness, no swelling, no edema, no deformity and no laceration.       Left lower leg: He exhibits swelling and edema.       Legs: Neurological: He is alert and oriented to person, place, and time.  Skin: Skin is warm and dry.  Psychiatric: He has a normal mood and affect. His behavior is normal.    ED Course   Procedures (including critical care time)  Results for orders placed during the hospital encounter of 06/22/13  CBC WITH DIFFERENTIAL      Result Value Range   WBC 6.9  4.0 - 10.5 K/uL   RBC 4.43  4.22 - 5.81 MIL/uL   Hemoglobin 13.1  13.0 - 17.0 g/dL   HCT 40.9  81.1 - 91.4 %   MCV 89.2  78.0 - 100.0 fL   MCH 29.6  26.0 - 34.0 pg   MCHC 33.2  30.0 - 36.0 g/dL   RDW 78.2  95.6 - 21.3 %   Platelets 165  150 - 400 K/uL   Neutrophils Relative % 67  43 - 77 %   Neutro Abs 4.6  1.7 - 7.7 K/uL   Lymphocytes Relative 23  12 - 46 %   Lymphs Abs 1.6  0.7 - 4.0 K/uL   Monocytes Relative 9  3 - 12 %   Monocytes Absolute 0.6  0.1 - 1.0 K/uL   Eosinophils Relative 1  0 - 5 %   Eosinophils Absolute 0.1  0.0 - 0.7 K/uL   Basophils Relative 0  0 - 1 %   Basophils Absolute 0.0  0.0 - 0.1 K/uL  COMPREHENSIVE METABOLIC PANEL      Result  Value Range   Sodium 135  135 - 145  mEq/L   Potassium 4.6  3.5 - 5.1 mEq/L   Chloride 100  96 - 112 mEq/L   CO2 26  19 - 32 mEq/L   Glucose, Bld 92  70 - 99 mg/dL   BUN 17  6 - 23 mg/dL   Creatinine, Ser 1.61  0.50 - 1.35 mg/dL   Calcium 9.0  8.4 - 09.6 mg/dL   Total Protein 6.4  6.0 - 8.3 g/dL   Albumin 3.6  3.5 - 5.2 g/dL   AST 21  0 - 37 U/L   ALT 13  0 - 53 U/L   Alkaline Phosphatase 125 (*) 39 - 117 U/L   Total Bilirubin 0.3  0.3 - 1.2 mg/dL   GFR calc non Af Amer 46 (*) >90 mL/min   GFR calc Af Amer 53 (*) >90 mL/min  URINALYSIS, ROUTINE W REFLEX MICROSCOPIC      Result Value Range   Color, Urine YELLOW  YELLOW   APPearance CLEAR  CLEAR   Specific Gravity, Urine 1.011  1.005 - 1.030   pH 6.0  5.0 - 8.0   Glucose, UA NEGATIVE  NEGATIVE mg/dL   Hgb urine dipstick NEGATIVE  NEGATIVE   Bilirubin Urine NEGATIVE  NEGATIVE   Ketones, ur NEGATIVE  NEGATIVE mg/dL   Protein, ur NEGATIVE  NEGATIVE mg/dL   Urobilinogen, UA 1.0  0.0 - 1.0 mg/dL   Nitrite NEGATIVE  NEGATIVE   Leukocytes, UA NEGATIVE  NEGATIVE  PRO B NATRIURETIC PEPTIDE      Result Value Range   Pro B Natriuretic peptide (BNP) 297.2  0 - 450 pg/mL  CG4 I-STAT (LACTIC ACID)      Result Value Range   Lactic Acid, Venous 1.40  0.5 - 2.2 mmol/L  POCT I-STAT TROPONIN I      Result Value Range   Troponin i, poc 0.00  0.00 - 0.08 ng/mL   Comment 3            DG Chest 2 View (Final result)  Result time: 06/22/13 18:57:20    Final result by Rad Results In Interface (06/22/13 18:57:20)    Narrative:   *RADIOLOGY REPORT*  Clinical Data: Weakness  CHEST - 2 VIEW  Comparison: September 10, 2010  Findings: There is a degree of underlying emphysema. There is mild scarring in the left base. The lungs are otherwise clear. The heart size is normal. The pulmonary vascularity reflects underlying emphysema. No adenopathy.  The patient is status post coronary artery bypass grafting. There are pacemaker leads attached to the right atrium right  ventricle. There is atherosclerotic change in the aorta.  IMPRESSION:  Emphysema. No edema or consolidation.   Original Report Authenticated By: Bretta Bang, M.D.             DG Abd 1 View (Final result)  Result time: 06/22/13 19:00:20    Final result by Rad Results In Interface (06/22/13 19:00:20)    Narrative:   *RADIOLOGY REPORT*  Clinical Data: Weakness, fall  ABDOMEN - 1 VIEW  Comparison: None.  Findings: No dilated loops of large or small bowel. No pathologic calcifications. Brachytherapy seeds noted in the prostate bed.  IMPRESSION: No evidence of bowel obstruction.  No evidence of trauma.   Original Report Authenticated By: Genevive Bi, M.D.             DG Hip Complete Right (Final result)  Result time: 06/22/13 19:01:10  Final result by Rad Results In Interface (06/22/13 19:01:10)    Narrative:   *RADIOLOGY REPORT*  Clinical Data: Fall, weakness  RIGHT HIP - COMPLETE 2+ VIEW  Comparison: None.  Findings: Hips are located. Dedicated view of the right hip demonstrates no femoral neck fracture. Vascular calcifications noted.  IMPRESSION: No evidence of pelvic fracture or right hip fracture.   Original Report Authenticated By: Genevive Bi, M.D.             DG Femur Right (Final result)  Result time: 06/22/13 19:08:40    Final result by Rad Results In Interface (06/22/13 19:08:40)    Narrative:   *RADIOLOGY REPORT*  Clinical Data: The weakness. Fall 3 weeks ago.  RIGHT FEMUR - 2 VIEW  Comparison: Right hip radiographs of the same day.  Findings: The right hip and knee are located. No acute bone or soft tissue abnormalities are present. There is no significant effusion. Atherosclerotic calcifications are present within the right femoral artery. The patient is status post the treatment with reactive prostate seeds.  IMPRESSION:  1. No acute abnormality. 2. Atherosclerosis.   Original Report Authenticated  By: Marin Roberts, M.D.         Date: 06/22/2013  Rate: 71  Rhythm: atrial paced  QRS Axis: normal  Intervals: normal  ST/T Wave abnormalities: nonspecific ST/T changes  Conduction Disutrbances:right bundle branch block  Narrative Interpretation:   Old EKG Reviewed: unchanged    No results found. No diagnosis found.  MDM  Exam as above, NAD, afebrile, no tachycardia or resp distress, no hypoxia, no wheeze. Doubt COPD exacerbation, CXR - NACPF - no infiltrated, doubt PNA. KUB w/out significant stool burden or obstructive pattern - abd soft and benign. Doubt SBO. Xray right hip and femur - no acute fx/dislocation or lytic lesion. BNP normal, CBC/CMP unremarkable, troponin negative, ECG w/out ischemia - doubt ACS.  u/a neg for infection, lactic acid normal. D/w internal medicine - likely intermittent claudication. D/w Vascular surgery Dr Edilia Bo - will follow in clinic in 1-2wks, take ASA 325mg . At this time does not appear to be limb ischemia. Doubt DVT. Stable for d/c home. Given return precautions and follow up instructions.  I have personally reviewed labs and imaging and considered in my MDM. Case d/w Dr Judd Lien  1. Claudication in peripheral vascular disease   2. Hyperlipidemia   3. CAD (coronary artery disease)   4. Claudication of right lower extremity   5. Dyspnea    Discharge Medication List as of 06/22/2013  9:51 PM     Darnelle Bos, MD [address], [facility] AND [place] [address] [phone]   As needed if symptoms worsen  Chuck Hint, MD [address] [place] [address] [phone]  Schedule an appointment as soon as possible for a visit for appointment in the next 1-2 wks    Audelia Hives, MD 06/23/13 0000

## 2013-06-22 NOTE — ED Notes (Addendum)
Per EMS- Pt comes from home where he lives alone. Reports weakness x 1 week and hasn't left the house in 2 weeks. States "I feel like I am going to die". C/o being constipated and has been belching. Has RBBB on EKG HR 86, BP 173/74. Pt is hard of hearing, but is alert and oriented. EMS reports initially right foot was purple in color. Color returned.

## 2013-06-23 NOTE — ED Provider Notes (Signed)
I saw and evaluated the patient, reviewed the resident's note and I agree with the findings and plan.  The patient presents with complaints of pain in the right leg for the past three weeks.  He says that he bumped it against a refrigerator and his been hurting on occasion since that time.  It is worse with ambulation and relieved with rest.  No shortness of breath.  No swelling, fever, or chills.  On exam, the patient afebrile and the vitals are stable.  The heart is regular rate and rhythm and the lungs are clear.  The abdomen is benign.  The bilateral lower extremities appear grossly normal and are symmetrical.  The DP distal pulses are palpable, the right possibly less profound than the left.    The workup reveals unremarkable cbc, cmp, and ekg (I agree with the resident's interpretation of the ekg).  The symptoms sound like claudication.  Due to the time of day, we are unable to obtain a vascular study.  We have spoken with both medicine and vascular surgery who both feel as though discharge with expedited follow up is appropriate.  Arrangements have been made for this follow up.  To return prn.  Geoffery Lyons, MD 06/23/13 [phone]

## 2013-12-06 ENCOUNTER — Encounter: Payer: Self-pay | Admitting: *Deleted

## 2013-12-08 ENCOUNTER — Inpatient Hospital Stay (HOSPITAL_COMMUNITY)
Admission: EM | Admit: 2013-12-08 | Discharge: 2013-12-15 | DRG: 690 | Disposition: A | Discharge status: Skilled Nursing Facility | Payer: Medicare Other | Attending: Internal Medicine | Admitting: Internal Medicine

## 2013-12-08 ENCOUNTER — Encounter: Payer: Self-pay | Admitting: Internal Medicine

## 2013-12-08 ENCOUNTER — Emergency Department (HOSPITAL_COMMUNITY): Payer: Medicare Other

## 2013-12-08 ENCOUNTER — Encounter (HOSPITAL_COMMUNITY): Payer: Self-pay | Admitting: Emergency Medicine

## 2013-12-08 ENCOUNTER — Encounter (HOSPITAL_COMMUNITY): Payer: Self-pay | Admitting: Internal Medicine

## 2013-12-08 DIAGNOSIS — Z7982 Long term (current) use of aspirin: Secondary | ICD-10-CM

## 2013-12-08 DIAGNOSIS — I359 Nonrheumatic aortic valve disorder, unspecified: Secondary | ICD-10-CM | POA: Diagnosis present

## 2013-12-08 DIAGNOSIS — N183 Chronic kidney disease, stage 3 unspecified: Secondary | ICD-10-CM | POA: Diagnosis present

## 2013-12-08 DIAGNOSIS — K224 Dyskinesia of esophagus: Secondary | ICD-10-CM | POA: Diagnosis present

## 2013-12-08 DIAGNOSIS — Z79899 Other long term (current) drug therapy: Secondary | ICD-10-CM

## 2013-12-08 DIAGNOSIS — B957 Other staphylococcus as the cause of diseases classified elsewhere: Secondary | ICD-10-CM | POA: Diagnosis present

## 2013-12-08 DIAGNOSIS — I6529 Occlusion and stenosis of unspecified carotid artery: Secondary | ICD-10-CM | POA: Diagnosis present

## 2013-12-08 DIAGNOSIS — H9190 Unspecified hearing loss, unspecified ear: Secondary | ICD-10-CM | POA: Diagnosis present

## 2013-12-08 DIAGNOSIS — N419 Inflammatory disease of prostate, unspecified: Secondary | ICD-10-CM | POA: Diagnosis present

## 2013-12-08 DIAGNOSIS — I10 Essential (primary) hypertension: Secondary | ICD-10-CM | POA: Diagnosis present

## 2013-12-08 DIAGNOSIS — R338 Other retention of urine: Secondary | ICD-10-CM | POA: Diagnosis not present

## 2013-12-08 DIAGNOSIS — Z8546 Personal history of malignant neoplasm of prostate: Secondary | ICD-10-CM

## 2013-12-08 DIAGNOSIS — K5900 Constipation, unspecified: Secondary | ICD-10-CM | POA: Diagnosis present

## 2013-12-08 DIAGNOSIS — I129 Hypertensive chronic kidney disease with stage 1 through stage 4 chronic kidney disease, or unspecified chronic kidney disease: Secondary | ICD-10-CM | POA: Diagnosis present

## 2013-12-08 DIAGNOSIS — I252 Old myocardial infarction: Secondary | ICD-10-CM

## 2013-12-08 DIAGNOSIS — Z87891 Personal history of nicotine dependence: Secondary | ICD-10-CM

## 2013-12-08 DIAGNOSIS — R0602 Shortness of breath: Secondary | ICD-10-CM | POA: Diagnosis not present

## 2013-12-08 DIAGNOSIS — Z888 Allergy status to other drugs, medicaments and biological substances status: Secondary | ICD-10-CM

## 2013-12-08 DIAGNOSIS — I4891 Unspecified atrial fibrillation: Secondary | ICD-10-CM | POA: Diagnosis present

## 2013-12-08 DIAGNOSIS — N12 Tubulo-interstitial nephritis, not specified as acute or chronic: Principal | ICD-10-CM | POA: Diagnosis present

## 2013-12-08 DIAGNOSIS — R339 Retention of urine, unspecified: Secondary | ICD-10-CM | POA: Diagnosis present

## 2013-12-08 DIAGNOSIS — Z951 Presence of aortocoronary bypass graft: Secondary | ICD-10-CM

## 2013-12-08 DIAGNOSIS — Z66 Do not resuscitate: Secondary | ICD-10-CM | POA: Diagnosis present

## 2013-12-08 DIAGNOSIS — M549 Dorsalgia, unspecified: Secondary | ICD-10-CM | POA: Diagnosis present

## 2013-12-08 DIAGNOSIS — I739 Peripheral vascular disease, unspecified: Secondary | ICD-10-CM | POA: Diagnosis present

## 2013-12-08 DIAGNOSIS — Z950 Presence of cardiac pacemaker: Secondary | ICD-10-CM

## 2013-12-08 DIAGNOSIS — J441 Chronic obstructive pulmonary disease with (acute) exacerbation: Secondary | ICD-10-CM | POA: Diagnosis not present

## 2013-12-08 DIAGNOSIS — E785 Hyperlipidemia, unspecified: Secondary | ICD-10-CM | POA: Diagnosis present

## 2013-12-08 DIAGNOSIS — B369 Superficial mycosis, unspecified: Secondary | ICD-10-CM | POA: Diagnosis not present

## 2013-12-08 DIAGNOSIS — I2510 Atherosclerotic heart disease of native coronary artery without angina pectoris: Secondary | ICD-10-CM | POA: Diagnosis present

## 2013-12-08 LAB — CBC WITH DIFFERENTIAL/PLATELET
BASOS ABS: 0 10*3/uL (ref 0.0–0.1)
Basophils Relative: 0 % (ref 0–1)
EOS ABS: 0.2 10*3/uL (ref 0.0–0.7)
EOS PCT: 2 % (ref 0–5)
HCT: 36.9 % — ABNORMAL LOW (ref 39.0–52.0)
Hemoglobin: 12.7 g/dL — ABNORMAL LOW (ref 13.0–17.0)
LYMPHS PCT: 16 % (ref 12–46)
Lymphs Abs: 1.8 10*3/uL (ref 0.7–4.0)
MCH: 31.6 pg (ref 26.0–34.0)
MCHC: 34.4 g/dL (ref 30.0–36.0)
MCV: 91.8 fL (ref 78.0–100.0)
Monocytes Absolute: 1.2 10*3/uL — ABNORMAL HIGH (ref 0.1–1.0)
Monocytes Relative: 11 % (ref 3–12)
Neutro Abs: 8.2 10*3/uL — ABNORMAL HIGH (ref 1.7–7.7)
Neutrophils Relative %: 72 % (ref 43–77)
PLATELETS: 154 10*3/uL (ref 150–400)
RBC: 4.02 MIL/uL — ABNORMAL LOW (ref 4.22–5.81)
RDW: 14 % (ref 11.5–15.5)
WBC: 11.4 10*3/uL — AB (ref 4.0–10.5)

## 2013-12-08 LAB — URINALYSIS, ROUTINE W REFLEX MICROSCOPIC
BILIRUBIN URINE: NEGATIVE
Glucose, UA: NEGATIVE mg/dL
Ketones, ur: NEGATIVE mg/dL
NITRITE: POSITIVE — AB
PH: 5.5 (ref 5.0–8.0)
Protein, ur: NEGATIVE mg/dL
SPECIFIC GRAVITY, URINE: 1.018 (ref 1.005–1.030)
Urobilinogen, UA: 0.2 mg/dL (ref 0.0–1.0)

## 2013-12-08 LAB — COMPREHENSIVE METABOLIC PANEL
ALT: 14 U/L (ref 0–53)
AST: 17 U/L (ref 0–37)
Albumin: 3.3 g/dL — ABNORMAL LOW (ref 3.5–5.2)
Alkaline Phosphatase: 166 U/L — ABNORMAL HIGH (ref 39–117)
BUN: 23 mg/dL (ref 6–23)
CO2: 22 mEq/L (ref 19–32)
Calcium: 8.7 mg/dL (ref 8.4–10.5)
Chloride: 97 mEq/L (ref 96–112)
Creatinine, Ser: 1.44 mg/dL — ABNORMAL HIGH (ref 0.50–1.35)
GFR calc Af Amer: 46 mL/min — ABNORMAL LOW (ref >90)
GFR calc non Af Amer: 40 mL/min — ABNORMAL LOW (ref >90)
Glucose, Bld: 112 mg/dL — ABNORMAL HIGH (ref 70–99)
Potassium: 5.2 mEq/L (ref 3.7–5.3)
SODIUM: 133 meq/L — AB (ref 137–147)
Total Bilirubin: 0.5 mg/dL (ref 0.3–1.2)
Total Protein: 6.4 g/dL (ref 6.0–8.3)

## 2013-12-08 LAB — URINE MICROSCOPIC-ADD ON

## 2013-12-08 LAB — LACTIC ACID, PLASMA: Lactic Acid, Venous: 1.2 mmol/L (ref 0.5–2.2)

## 2013-12-08 MED ORDER — DEXTROSE 5 % IV SOLN
1.0000 g | Freq: Once | INTRAVENOUS | Status: AC
  Administered 2013-12-08: 1 g via INTRAVENOUS
  Filled 2013-12-08: qty 10

## 2013-12-08 NOTE — ED Provider Notes (Signed)
CSN: [id number]     Arrival date & time 12/08/13  1552 History   First MD Initiated Contact with Patient 12/08/13 1633     Chief Complaint  Patient presents with  . Urinary Retention  . Flank Pain   (Consider location/radiation/quality/duration/timing/severity/associated sxs/prior Treatment) Patient is a 80 y.o. male presenting with back pain. History limited by: pt severely hard of hearing.  Back Pain Location:  Lumbar spine Quality:  Aching Radiates to:  Does not radiate Pain severity:  Severe Onset quality:  Gradual Duration: started yesterday. Timing:  Constant Progression:  Waxing and waning Chronicity:  New Context comment:  Went to PCPs office today.  He was unable to urinate at the time or since. Relieved by:  Nothing Worsened by:  Nothing tried Associated symptoms: no abdominal pain, no bladder incontinence, no chest pain and no fever     Past Medical History  Diagnosis Date  . COPD (chronic obstructive pulmonary disease)   . Coronary artery disease   . Hypertension   . High cholesterol   . MI (myocardial infarction)   . Cancer     prostate  . Chronic kidney disease (CKD) stage G3a/A1, moderately decreased glomerular filtration rate (GFR) between 45-59 mL/min/1.73 square meter and albuminuria creatinine ratio less than 30 mg/g   . Arrhythmia   . Coronary atherosclerosis of native coronary artery    Past Surgical History  Procedure Laterality Date  . Cardiac surgery     History reviewed. No pertinent family history. History  Substance Use Topics  . Smoking status: Former Research scientist (life sciences)  . Smokeless tobacco: Not on file  . Alcohol Use: No    Review of Systems  Constitutional: Negative for fever.  HENT: Negative for congestion.   Respiratory: Negative for cough and shortness of breath.   Cardiovascular: Negative for chest pain.  Gastrointestinal: Negative for nausea, vomiting, abdominal pain and diarrhea.  Genitourinary: Negative for bladder incontinence.   Musculoskeletal: Positive for back pain.  All other systems reviewed and are negative.    Allergies  Albuterol  Home Medications   Current Outpatient Rx  Name  Route  Sig  Dispense  Refill  . amLODipine (NORVASC) 5 MG tablet   Oral   Take 5 mg by mouth daily.         Marland Kitchen aspirin 325 MG EC tablet   Oral   Take 325 mg by mouth daily.         Marland Kitchen atorvastatin (LIPITOR) 10 MG tablet   Oral   Take 10 mg by mouth every evening.         . beclomethasone (QVAR) 40 MCG/ACT inhaler   Inhalation   Inhale 2 puffs into the lungs 2 (two) times daily.         . Cyanocobalamin 1000 MCG/ML KIT   Injection   Inject 1 vial as directed every 30 (thirty) days.         Marland Kitchen docusate sodium (COLACE) 100 MG capsule   Oral   Take 100 mg by mouth daily.         Marland Kitchen esomeprazole (NEXIUM) 40 MG capsule   Oral   Take 40 mg by mouth daily before breakfast.         . Memantine HCl ER (NAMENDA XR) 28 MG CP24   Oral   Take 28 mg by mouth every morning.          . metoprolol tartrate (LOPRESSOR) 25 MG tablet   Oral   Take 25 mg by  mouth 2 (two) times daily.         . Multiple Vitamins-Minerals (PRESERVISION AREDS 2 PO)   Oral   Take 1 tablet by mouth 2 (two) times daily.         . polyethylene glycol (MIRALAX / GLYCOLAX) packet   Oral   Take 17 g by mouth daily.         Marland Kitchen tiotropium (SPIRIVA) 18 MCG inhalation capsule   Inhalation   Place 18 mcg into inhaler and inhale daily.          BP 157/69  Pulse 75  Temp(Src) 97.5 F (36.4 C) (Oral)  Resp 20  Wt 174 lb 9 oz (79.181 kg)  SpO2 91% Physical Exam  Nursing note and vitals reviewed. Constitutional: He is oriented to person, place, and time. He appears well-developed and well-nourished. No distress.  elderly  HENT:  Head: Normocephalic and atraumatic.  Mouth/Throat: Oropharynx is clear and moist.  Eyes: Conjunctivae are normal. Pupils are equal, round, and reactive to light. No scleral icterus.  Neck: Neck  supple.  Cardiovascular: Normal rate, regular rhythm, normal heart sounds and intact distal pulses.   No murmur heard. Pulmonary/Chest: Effort normal and breath sounds normal. No stridor. No respiratory distress. He has no wheezes. He has no rales.  Abdominal: Soft. He exhibits no distension. There is no tenderness. There is no rebound and no guarding.  Musculoskeletal: Normal range of motion. He exhibits no edema.       Lumbar back: He exhibits no tenderness and no bony tenderness.  Neurological: He is alert and oriented to person, place, and time.  Skin: Skin is warm and dry. No rash noted.  Psychiatric: He has a normal mood and affect. His behavior is normal.    ED Course  Procedures (including critical care time) Labs Review Labs Reviewed  CBC WITH DIFFERENTIAL - Abnormal; Notable for the following:    WBC 11.4 (*)    RBC 4.02 (*)    Hemoglobin 12.7 (*)    HCT 36.9 (*)    Neutro Abs 8.2 (*)    Monocytes Absolute 1.2 (*)    All other components within normal limits  COMPREHENSIVE METABOLIC PANEL - Abnormal; Notable for the following:    Sodium 133 (*)    Glucose, Bld 112 (*)    Creatinine, Ser 1.44 (*)    Albumin 3.3 (*)    Alkaline Phosphatase 166 (*)    GFR calc non Af Amer 40 (*)    GFR calc Af Amer 46 (*)    All other components within normal limits  URINALYSIS, ROUTINE W REFLEX MICROSCOPIC - Abnormal; Notable for the following:    APPearance CLOUDY (*)    Hgb urine dipstick SMALL (*)    Nitrite POSITIVE (*)    Leukocytes, UA LARGE (*)    All other components within normal limits  URINE MICROSCOPIC-ADD ON - Abnormal; Notable for the following:    Bacteria, UA MANY (*)    Casts HYALINE CASTS (*)    All other components within normal limits  URINE CULTURE  LACTIC ACID, PLASMA   Imaging Review US Abdomen Complete  12/08/2013   CLINICAL DATA:  80 year old male with abdominal pain and urinary retention.  EXAM: ULTRASOUND ABDOMEN COMPLETE  COMPARISON:  None.  FINDINGS:  Gallbladder:  Multiple mobile gallstones are identified, the largest measuring 8 mm. There is no evidence of gallbladder wall thickening, pericholecystic fluid or sonographic Murphy sign.  Common bile duct:  Diameter: 7 mm.  There is no evidence of intrahepatic or extrahepatic biliary dilatation.  Liver:  No focal lesion identified. Within normal limits in parenchymal echogenicity.  IVC:  Not well visualized secondary to overlying bowel gas.  Pancreas:  Visualized portion unremarkable but much of the pancreas is obscured by overlying bowel gas.  Spleen:  Size and appearance within normal limits.  Right Kidney:  Length: 8.7 cm with renal cortical thinning. Echogenicity within normal limits. No mass or hydronephrosis visualized.  Left Kidney:  Length: 9 cm with renal cortical thinning. Echogenicity within normal limits. No mass or hydronephrosis visualized.  Abdominal aorta:  Overlying bowel gas obscures some portions of the abdominal aorta. There is no evidence of aneurysm.  Other findings:  None.  IMPRESSION: Cholelithiasis without sonographic evidence of acute cholecystitis. No evidence of biliary dilatation.  IVC, pancreas and portions of the abdominal aorta not well visualized.  No evidence of acute abnormality.   Electronically Signed   By: Hassan Rowan M.D.   On: 12/08/2013 19:07    EKG Interpretation   None       MDM   1. Pyelonephritis   2. A-fib   3. CAD (coronary artery disease)   4. CKD (chronic kidney disease), stage III    80 yo male with primary complaint of low back pain since last night.  He has been unable to urinate for past 9 or 10 hours.  Bladder scan showed about 175cc urine.  Catheterized for urine which showed obvious UTI.  Given Ceftriaxone.  Ultrasound initially obtained due to undifferentiated back pain in elderly male.  Although Aorta incompletely visualized, UTI is much better explanation for his symptoms.  Consulted internal medicine for admission.    Houston Siren,  MD 12/08/13 [phone]

## 2013-12-08 NOTE — ED Notes (Signed)
2nd I/O attempt successful.  Emptied 25ml of urine and sent urinalysis to lab.

## 2013-12-08 NOTE — ED Notes (Signed)
Report to floor RN

## 2013-12-08 NOTE — ED Notes (Signed)
Attempted I/O cath per doctor.   Unsuccessful.  Bladder scanned pt and yielded 14ml.  Attempting 2nd I/O cath per doctor.

## 2013-12-08 NOTE — ED Notes (Signed)
Per daughter pt reports Right side flank pain x2-3 days, increase pain last night and some confusion, pt seen at [facility] today at 1030 am and instructed to go home to see if he passes the kidney stone on his own. Daughter reports pt 16 oz bottle of water on the way to his urologist, pt drank an additional 32 ounces of water and has not passed any urine. Pt last urinated at 0800 this am. [facility] would not cath pt in their office.

## 2013-12-09 ENCOUNTER — Observation Stay (HOSPITAL_COMMUNITY): Payer: Medicare Other

## 2013-12-09 DIAGNOSIS — R338 Other retention of urine: Secondary | ICD-10-CM | POA: Diagnosis not present

## 2013-12-09 DIAGNOSIS — R0602 Shortness of breath: Secondary | ICD-10-CM | POA: Diagnosis not present

## 2013-12-09 DIAGNOSIS — H9190 Unspecified hearing loss, unspecified ear: Secondary | ICD-10-CM | POA: Diagnosis present

## 2013-12-09 LAB — CBC
HCT: 34.6 % — ABNORMAL LOW (ref 39.0–52.0)
Hemoglobin: 11.9 g/dL — ABNORMAL LOW (ref 13.0–17.0)
MCH: 31.6 pg (ref 26.0–34.0)
MCHC: 34.4 g/dL (ref 30.0–36.0)
MCV: 91.8 fL (ref 78.0–100.0)
Platelets: 153 10*3/uL (ref 150–400)
RBC: 3.77 MIL/uL — ABNORMAL LOW (ref 4.22–5.81)
RDW: 14 % (ref 11.5–15.5)
WBC: 8.8 10*3/uL (ref 4.0–10.5)

## 2013-12-09 LAB — CREATININE, SERUM
CREATININE: 1.31 mg/dL (ref 0.50–1.35)
GFR calc Af Amer: 52 mL/min — ABNORMAL LOW (ref >90)
GFR calc non Af Amer: 45 mL/min — ABNORMAL LOW (ref >90)

## 2013-12-09 LAB — PRO B NATRIURETIC PEPTIDE: Pro B Natriuretic peptide (BNP): 609.3 pg/mL — ABNORMAL HIGH (ref 0–450)

## 2013-12-09 MED ORDER — MEMANTINE HCL 10 MG PO TABS
10.0000 mg | ORAL_TABLET | Freq: Two times a day (BID) | ORAL | Status: DC
  Administered 2013-12-09 – 2013-12-15 (×12): 10 mg via ORAL
  Filled 2013-12-09 (×14): qty 1

## 2013-12-09 MED ORDER — ATORVASTATIN CALCIUM 10 MG PO TABS
10.0000 mg | ORAL_TABLET | Freq: Every evening | ORAL | Status: DC
  Administered 2013-12-09 – 2013-12-14 (×5): 10 mg via ORAL
  Filled 2013-12-09 (×7): qty 1

## 2013-12-09 MED ORDER — LABETALOL HCL 5 MG/ML IV SOLN
10.0000 mg | INTRAVENOUS | Status: DC | PRN
  Filled 2013-12-09: qty 4

## 2013-12-09 MED ORDER — AMLODIPINE BESYLATE 5 MG PO TABS
5.0000 mg | ORAL_TABLET | Freq: Every day | ORAL | Status: DC
  Administered 2013-12-09 – 2013-12-15 (×7): 5 mg via ORAL
  Filled 2013-12-09 (×7): qty 1

## 2013-12-09 MED ORDER — SODIUM CHLORIDE 0.9 % IJ SOLN
3.0000 mL | Freq: Two times a day (BID) | INTRAMUSCULAR | Status: DC
  Administered 2013-12-09 – 2013-12-15 (×7): 3 mL via INTRAVENOUS

## 2013-12-09 MED ORDER — DEXTROSE-NACL 5-0.9 % IV SOLN
INTRAVENOUS | Status: DC
Start: 2013-12-09 — End: 2013-12-15
  Administered 2013-12-09 – 2013-12-10 (×3): via INTRAVENOUS
  Administered 2013-12-12: 18:00:00 20 mL/h via INTRAVENOUS
  Administered 2013-12-14: 1 mL via INTRAVENOUS

## 2013-12-09 MED ORDER — OCUVITE-LUTEIN PO CAPS
1.0000 | ORAL_CAPSULE | Freq: Every day | ORAL | Status: DC
  Administered 2013-12-09 – 2013-12-15 (×7): 1 via ORAL
  Filled 2013-12-09 (×8): qty 1

## 2013-12-09 MED ORDER — CYANOCOBALAMIN 1000 MCG/ML IJ KIT: 1.0000 | PACK | INTRAMUSCULAR | Status: DC

## 2013-12-09 MED ORDER — BUPROPION HCL 75 MG PO TABS
75.0000 mg | ORAL_TABLET | Freq: Two times a day (BID) | ORAL | Status: DC
  Administered 2013-12-09 – 2013-12-15 (×13): 75 mg via ORAL
  Filled 2013-12-09 (×17): qty 1

## 2013-12-09 MED ORDER — LEVALBUTEROL HCL 0.63 MG/3ML IN NEBU
0.6300 mg | INHALATION_SOLUTION | RESPIRATORY_TRACT | Status: DC | PRN
  Administered 2013-12-14: 12:00:00 0.63 mg via RESPIRATORY_TRACT
  Filled 2013-12-09 (×3): qty 3

## 2013-12-09 MED ORDER — METHYLPREDNISOLONE SODIUM SUCC 125 MG IJ SOLR
80.0000 mg | Freq: Once | INTRAMUSCULAR | Status: AC
  Administered 2013-12-09: 80 mg via INTRAVENOUS
  Filled 2013-12-09: qty 1.28

## 2013-12-09 MED ORDER — PRESERVISION AREDS 2 PO CAPS: 2.0000 | ORAL_CAPSULE | Freq: Two times a day (BID) | ORAL | Status: DC

## 2013-12-09 MED ORDER — DEXTROSE 5 % IV SOLN
1.0000 g | INTRAVENOUS | Status: DC
  Administered 2013-12-09 – 2013-12-11 (×3): 1 g via INTRAVENOUS
  Filled 2013-12-09 (×4): qty 10

## 2013-12-09 MED ORDER — DOCUSATE SODIUM 100 MG PO CAPS
100.0000 mg | ORAL_CAPSULE | Freq: Two times a day (BID) | ORAL | Status: DC
  Administered 2013-12-09 – 2013-12-15 (×12): 100 mg via ORAL
  Filled 2013-12-09 (×15): qty 1

## 2013-12-09 MED ORDER — LEVALBUTEROL HCL 0.63 MG/3ML IN NEBU
0.6300 mg | INHALATION_SOLUTION | Freq: Three times a day (TID) | RESPIRATORY_TRACT | Status: DC
  Administered 2013-12-09 – 2013-12-10 (×3): 0.63 mg via RESPIRATORY_TRACT
  Filled 2013-12-09 (×5): qty 3

## 2013-12-09 MED ORDER — METOPROLOL TARTRATE 25 MG PO TABS
25.0000 mg | ORAL_TABLET | Freq: Two times a day (BID) | ORAL | Status: DC
  Administered 2013-12-09 – 2013-12-15 (×13): 25 mg via ORAL
  Filled 2013-12-09 (×15): qty 1

## 2013-12-09 MED ORDER — MEMANTINE HCL ER 28 MG PO CP24: 28.0000 mg | ORAL_CAPSULE | Freq: Every morning | ORAL | Status: DC

## 2013-12-09 MED ORDER — LORATADINE 10 MG PO TABS
10.0000 mg | ORAL_TABLET | Freq: Every day | ORAL | Status: DC
  Administered 2013-12-09 – 2013-12-15 (×7): 10 mg via ORAL
  Filled 2013-12-09 (×7): qty 1

## 2013-12-09 MED ORDER — ASPIRIN EC 325 MG PO TBEC
325.0000 mg | DELAYED_RELEASE_TABLET | Freq: Every day | ORAL | Status: DC
  Administered 2013-12-09 – 2013-12-15 (×7): 325 mg via ORAL
  Filled 2013-12-09 (×7): qty 1

## 2013-12-09 MED ORDER — CYANOCOBALAMIN 1000 MCG/ML IJ SOLN
1000.0000 ug | INTRAMUSCULAR | Status: DC
  Administered 2013-12-14: 1000 ug via INTRAMUSCULAR
  Filled 2013-12-09: qty 1

## 2013-12-09 MED ORDER — TIOTROPIUM BROMIDE MONOHYDRATE 18 MCG IN CAPS
18.0000 ug | ORAL_CAPSULE | Freq: Every evening | RESPIRATORY_TRACT | Status: DC
  Administered 2013-12-10 – 2013-12-15 (×5): 18 ug via RESPIRATORY_TRACT
  Filled 2013-12-09 (×2): qty 5

## 2013-12-09 MED ORDER — FLUTICASONE PROPIONATE HFA 44 MCG/ACT IN AERO
1.0000 | INHALATION_SPRAY | Freq: Two times a day (BID) | RESPIRATORY_TRACT | Status: DC
  Administered 2013-12-09 – 2013-12-15 (×11): 1 via RESPIRATORY_TRACT
  Filled 2013-12-09: qty 10.6

## 2013-12-09 MED ORDER — PANTOPRAZOLE SODIUM 40 MG PO TBEC
40.0000 mg | DELAYED_RELEASE_TABLET | Freq: Every day | ORAL | Status: DC
  Administered 2013-12-09 – 2013-12-15 (×7): 40 mg via ORAL
  Filled 2013-12-09 (×7): qty 1

## 2013-12-09 MED ORDER — HEPARIN SODIUM (PORCINE) 5000 UNIT/ML IJ SOLN
5000.0000 [IU] | Freq: Three times a day (TID) | INTRAMUSCULAR | Status: DC
  Administered 2013-12-09 – 2013-12-15 (×19): 5000 [IU] via SUBCUTANEOUS
  Filled 2013-12-09 (×21): qty 1

## 2013-12-09 MED ORDER — ACETAMINOPHEN 500 MG PO TABS
1000.0000 mg | ORAL_TABLET | Freq: Two times a day (BID) | ORAL | Status: DC | PRN
  Administered 2013-12-09 – 2013-12-10 (×3): 1000 mg via ORAL
  Filled 2013-12-09 (×3): qty 2

## 2013-12-09 NOTE — Progress Notes (Signed)
Pt has abd distension and complaints of feeling the urge to urinate but can not. Bladder scan results showed >976mL in bladder. MD notified. Orders received.

## 2013-12-09 NOTE — Plan of Care (Signed)
Problem: Phase I Progression Outcomes Goal: Voiding-avoid urinary catheter unless indicated Outcome: Not Met (add Reason) Acute Urinary Retention

## 2013-12-09 NOTE — Progress Notes (Signed)
Subjective: John [name] is a very pleasant elderly male with COPD, CAD, hypertension, hyperlipidemia, cardiovascular disease and claudication who presents to the emergency room with back pain. He was found to have pyuria and an elevated white count. Ultrasound of the abdomen did not really feel any pyelonephritis but he is complaining of shortness of breath currently and has a bladder scan with more than 1000 cc of urine. He is being catheterized momentarily. He has rales in his lungs and may be in heart failure is well. He was started on Rocephin for presumed UTI. Could certainly have prostatitis  Objective: Weight change:   Intake/Output Summary (Last 24 hours) at 12/09/13 0927 Last data filed at 12/09/13 0547  Gross per 24 hour  Intake  367.5 ml  Output      0 ml  Net  367.5 ml   Filed Vitals:   12/09/13 0015 12/09/13 0153 12/09/13 0505 12/09/13 0902  BP: 202/85 136/68 173/76   Pulse: 75 83 76   Temp: 97.9 F (36.6 C)  98 F (36.7 C)   TempSrc: Oral  Axillary   Resp: 18  18   Height: 5' 11.5" (1.816 m)     Weight: 78.79 kg (173 lb 11.2 oz)     SpO2: 99%  98% 96%    General Appearance: Alert, cooperative, short of breath, appears stated age, complaining of lower abdominal pain Lungs: Bilateral rales posteriorly with increased work of breathing Heart: Regular rate and rhythm, S1 and S2 normal, no murmur, rub or gallop Abdomen: Distended and tender in the lower abdominal region with normal bowel sounds Extremities: Extremities normal, atraumatic, no cyanosis or edema Neuro: Alert and oriented, nonfocal  Lab Results: Results for orders placed during the hospital encounter of 12/08/13 (from the past 48 hour(s))  URINALYSIS, ROUTINE W REFLEX MICROSCOPIC     Status: Abnormal   Collection Time    12/08/13  5:40 PM      Result Value Range   Color, Urine YELLOW  YELLOW   APPearance CLOUDY (*) CLEAR   Specific Gravity, Urine 1.018  1.005 - 1.030   pH 5.5  5.0 - 8.0   Glucose, UA  NEGATIVE  NEGATIVE mg/dL   Hgb urine dipstick SMALL (*) NEGATIVE   Bilirubin Urine NEGATIVE  NEGATIVE   Ketones, ur NEGATIVE  NEGATIVE mg/dL   Protein, ur NEGATIVE  NEGATIVE mg/dL   Urobilinogen, UA 0.2  0.0 - 1.0 mg/dL   Nitrite POSITIVE (*) NEGATIVE   Leukocytes, UA LARGE (*) NEGATIVE  URINE MICROSCOPIC-ADD ON     Status: Abnormal   Collection Time    12/08/13  5:40 PM      Result Value Range   WBC, UA TOO NUMEROUS TO COUNT  <3 WBC/hpf   RBC / HPF 3-6  <3 RBC/hpf   Bacteria, UA MANY (*) RARE   Casts HYALINE CASTS (*) NEGATIVE   Urine-Other AMORPHOUS URATES/PHOSPHATES    CBC WITH DIFFERENTIAL     Status: Abnormal   Collection Time    12/08/13  6:03 PM      Result Value Range   WBC 11.4 (*) 4.0 - 10.5 K/uL   RBC 4.02 (*) 4.22 - 5.81 MIL/uL   Hemoglobin 12.7 (*) 13.0 - 17.0 g/dL   HCT 36.9 (*) 39.0 - 52.0 %   MCV 91.8  78.0 - 100.0 fL   MCH 31.6  26.0 - 34.0 pg   MCHC 34.4  30.0 - 36.0 g/dL   RDW 14.0  11.5 - 15.5 %  Platelets 154  150 - 400 K/uL   Neutrophils Relative % 72  43 - 77 %   Neutro Abs 8.2 (*) 1.7 - 7.7 K/uL   Lymphocytes Relative 16  12 - 46 %   Lymphs Abs 1.8  0.7 - 4.0 K/uL   Monocytes Relative 11  3 - 12 %   Monocytes Absolute 1.2 (*) 0.1 - 1.0 K/uL   Eosinophils Relative 2  0 - 5 %   Eosinophils Absolute 0.2  0.0 - 0.7 K/uL   Basophils Relative 0  0 - 1 %   Basophils Absolute 0.0  0.0 - 0.1 K/uL  COMPREHENSIVE METABOLIC PANEL     Status: Abnormal   Collection Time    12/08/13  6:03 PM      Result Value Range   Sodium 133 (*) 137 - 147 mEq/L   Potassium 5.2  3.7 - 5.3 mEq/L   Chloride 97  96 - 112 mEq/L   CO2 22  19 - 32 mEq/L   Glucose, Bld 112 (*) 70 - 99 mg/dL   BUN 23  6 - 23 mg/dL   Creatinine, Ser 1.44 (*) 0.50 - 1.35 mg/dL   Calcium 8.7  8.4 - 10.5 mg/dL   Total Protein 6.4  6.0 - 8.3 g/dL   Albumin 3.3 (*) 3.5 - 5.2 g/dL   AST 17  0 - 37 U/L   ALT 14  0 - 53 U/L   Alkaline Phosphatase 166 (*) 39 - 117 U/L   Total Bilirubin 0.5  0.3 -  1.2 mg/dL   GFR calc non Af Amer 40 (*) >90 mL/min   GFR calc Af Amer 46 (*) >90 mL/min   Comment: (NOTE)     The eGFR has been calculated using the CKD EPI equation.     This calculation has not been validated in all clinical situations.     eGFR's persistently <90 mL/min signify possible Chronic Kidney     Disease.  LACTIC ACID, PLASMA     Status: None   Collection Time    12/08/13  6:03 PM      Result Value Range   Lactic Acid, Venous 1.2  0.5 - 2.2 mmol/L  CBC     Status: Abnormal   Collection Time    12/09/13  1:33 AM      Result Value Range   WBC 8.8  4.0 - 10.5 K/uL   RBC 3.77 (*) 4.22 - 5.81 MIL/uL   Hemoglobin 11.9 (*) 13.0 - 17.0 g/dL   HCT 34.6 (*) 39.0 - 52.0 %   MCV 91.8  78.0 - 100.0 fL   MCH 31.6  26.0 - 34.0 pg   MCHC 34.4  30.0 - 36.0 g/dL   RDW 14.0  11.5 - 15.5 %   Platelets 153  150 - 400 K/uL  CREATININE, SERUM     Status: Abnormal   Collection Time    12/09/13  1:33 AM      Result Value Range   Creatinine, Ser 1.31  0.50 - 1.35 mg/dL   GFR calc non Af Amer 45 (*) >90 mL/min   GFR calc Af Amer 52 (*) >90 mL/min   Comment: (NOTE)     The eGFR has been calculated using the CKD EPI equation.     This calculation has not been validated in all clinical situations.     eGFR's persistently <90 mL/min signify possible Chronic Kidney     Disease.    Studies/Results: US  Abdomen Complete  12/08/2013   CLINICAL DATA:  80 year old male with abdominal pain and urinary retention.  EXAM: ULTRASOUND ABDOMEN COMPLETE  COMPARISON:  None.  FINDINGS: Gallbladder:  Multiple mobile gallstones are identified, the largest measuring 8 mm. There is no evidence of gallbladder wall thickening, pericholecystic fluid or sonographic Murphy sign.  Common bile duct:  Diameter: 7 mm. There is no evidence of intrahepatic or extrahepatic biliary dilatation.  Liver:  No focal lesion identified. Within normal limits in parenchymal echogenicity.  IVC:  Not well visualized secondary to overlying  bowel gas.  Pancreas:  Visualized portion unremarkable but much of the pancreas is obscured by overlying bowel gas.  Spleen:  Size and appearance within normal limits.  Right Kidney:  Length: 8.7 cm with renal cortical thinning. Echogenicity within normal limits. No mass or hydronephrosis visualized.  Left Kidney:  Length: 9 cm with renal cortical thinning. Echogenicity within normal limits. No mass or hydronephrosis visualized.  Abdominal aorta:  Overlying bowel gas obscures some portions of the abdominal aorta. There is no evidence of aneurysm.  Other findings:  None.  IMPRESSION: Cholelithiasis without sonographic evidence of acute cholecystitis. No evidence of biliary dilatation.  IVC, pancreas and portions of the abdominal aorta not well visualized.  No evidence of acute abnormality.   Electronically Signed   By: Hassan Rowan M.D.   On: 12/08/2013 19:07   Medications: Scheduled Meds: . amLODipine  5 mg Oral Daily  . aspirin  325 mg Oral Daily  . atorvastatin  10 mg Oral QPM  . buPROPion  75 mg Oral BID WC  . cefTRIAXone (ROCEPHIN)  IV  1 g Intravenous Q24H  . [START ON 12/14/2013] cyanocobalamin  1,000 mcg Intramuscular Q30 days  . docusate sodium  100 mg Oral BID  . fluticasone  1 puff Inhalation BID  . heparin  5,000 Units Subcutaneous Q8H  . loratadine  10 mg Oral Daily  . memantine  10 mg Oral BID  . metoprolol tartrate  25 mg Oral BID  . multivitamin-lutein  1 capsule Oral Daily  . pantoprazole  40 mg Oral Daily  . sodium chloride  3 mL Intravenous Q12H  . tiotropium  18 mcg Inhalation QPM   Continuous Infusions: . dextrose 5 % and 0.9% NaCl 50 mL/hr at 12/09/13 0050   PRN Meds:.acetaminophen, labetalol  Assessment/Plan: Principal Problem:   Pyelonephritis - suspect this may be more prostatitis and pyelonephritis with acute urinary retention. Replace catheter and continue Rocephin Active Problems:   Back pain - likely from prostatitis and/or urinary retention   HTN (hypertension)  - controlled   CAD (coronary artery disease) - no complaints of chest pain   CKD (chronic kidney disease), stage III   A-fib - on aspirin. He is not a Coumadin candidate   Shortness of breath - shortness of breath likely secondary to mild to moderate CHF. KVO fluids and check chest x-ray and BNP   Acute urinary retention - place Foley catheter. Consider Flomax   Hearing loss - severe hearing loss - aware   LOS: 1 day   Henrine Screws, MD 12/09/2013, 9:27 AM

## 2013-12-09 NOTE — H&P (Signed)
[facility] History and Physical  John [name] DOB: Apr 01, 1917    PCP:   Horton Finer, MD   Chief Complaint: low back pain.  HPI: John [name] is an 80 y.o. male with hx of COPD, CAD, HTN, hyperlipidemia, hx of PVD and claudication, presents to the ER with back pain.  He was having no fever, chills, nausea, vomiting, dyuria, coughs, or leg pain.  Evalaution in the ER included a UA which showed TNTC WBCs, mild leukocytosis with WBC of 11K, and Cr of 1.4.  US of the abdomen was negative for any acute process.  His LFTs were normal.  He was found to be a bit hypertensive.  Hospitalist was asked to admit him for pyelonephritis.     Rewiew of Systems:  Constitutional: Negative for malaise, fever and chills. No significant weight loss or weight gain Eyes: Negative for eye pain, redness and discharge, diplopia, visual changes, or flashes of light. ENMT: Negative for ear pain, hoarseness, nasal congestion, sinus pressure and sore throat. No headaches; tinnitus, drooling, or problem swallowing. Cardiovascular: Negative for chest pain, palpitations, diaphoresis, dyspnea and peripheral edema. ; No orthopnea, PND Respiratory: Negative for cough, hemoptysis, wheezing and stridor. No pleuritic chestpain. Gastrointestinal: Negative for nausea, vomiting, diarrhea, constipation, abdominal pain, melena, blood in stool, hematemesis, jaundice and rectal bleeding.    Genitourinary: Negative for frequency, dysuria, incontinence,flank pain and hematuria; Musculoskeletal: Negative for neck pain. Negative for swelling and trauma.;  Skin: . Negative for pruritus, rash, abrasions, bruising and skin lesion.; ulcerations Neuro: Negative for headache, lightheadedness and neck stiffness. Negative for weakness, altered level of consciousness , altered mental status, extremity weakness, burning feet, involuntary movement, seizure and syncope.  Psych: negative for anxiety, depression,  insomnia, tearfulness, panic attacks, hallucinations, paranoia, suicidal or homicidal ideation    Past Medical History  Diagnosis Date  . COPD (chronic obstructive pulmonary disease)   . Hypertension   . High cholesterol   . MI (myocardial infarction)   . Chronic kidney disease (CKD) stage G3a/A1, moderately decreased glomerular filtration rate (GFR) between 45-59 mL/min/1.73 square meter and albuminuria creatinine ratio less than 30 mg/g   . Arrhythmia   . Atrial fibrillation   . Occlusion and stenosis of carotid artery without mention of cerebral infarction   . Urinary incontinence   . Colon polyps     1999-adenomas; 2002-hyperplastic polyp  . Cancer     Prostate. Seed implants 1994; F/B Dr. Rosana Hoes  . Tachycardia-bradycardia syndrome     With DDD pacemaker 1998  . Coronary artery disease     CABG 1994, LIMA to LAD, sequential SVG to 1st and 2nd OM, sequential SVG to PDA and PL. Atretic LIMA and BMS LAD 2004  . Coronary atherosclerosis of native coronary artery   . Aortic valve sclerosis   . Ingrown toenail 2011  . Dysphagia     Due to esophageal dysmotility  . Memory loss   . Flank pain   . Esophageal dysmotility     Past Surgical History  Procedure Laterality Date  . Cardiac surgery    . Coronary artery bypass graft      CABG 1994, LIMA to LAD, sequential SVG to 1st and 2nd OM, sequential SVG to PDA and PL. Atretic LIMA and BMS LAD 2004  . Ptca    . Inguinal hernia repair    . Vocal cord surgery    . Appendectomy    . Tonsillectomy and adenoidectomy  1938  . Pacemaker insertion  12/1996  .  Refractive surgery Left 1998    Dr. Nyoka Cowden @ [place]  . Pacemaker generator change  02/24/07    Elective change out for generator    Medications:  HOME MEDS: Prior to Admission medications   Medication Sig Start Date End Date Taking? Authorizing Provider  acetaminophen (TYLENOL) 500 MG tablet Take 1,000 mg by mouth 2 (two) times daily as needed for mild pain.   Yes  Historical Provider, MD  amLODipine (NORVASC) 5 MG tablet Take 5 mg by mouth daily.   Yes Historical Provider, MD  aspirin 325 MG EC tablet Take 325 mg by mouth daily.   Yes Historical Provider, MD  atorvastatin (LIPITOR) 10 MG tablet Take 10 mg by mouth every evening.   Yes Historical Provider, MD  beclomethasone (QVAR) 40 MCG/ACT inhaler Inhale 2 puffs into the lungs every evening.    Yes Historical Provider, MD  buPROPion (WELLBUTRIN) 75 MG tablet Take 75 mg by mouth 2 (two) times daily.   Yes Historical Provider, MD  Cyanocobalamin 1000 MCG/ML KIT Inject 1 vial as directed every 30 (thirty) days.   Yes Historical Provider, MD  esomeprazole (NEXIUM) 40 MG capsule Take 40 mg by mouth daily before breakfast.   Yes Historical Provider, MD  loratadine (CLARITIN) 10 MG tablet Take 10 mg by mouth every morning.   Yes Historical Provider, MD  Memantine HCl ER (NAMENDA XR) 28 MG CP24 Take 28 mg by mouth every morning.    Yes Historical Provider, MD  metoprolol tartrate (LOPRESSOR) 25 MG tablet Take 25 mg by mouth 2 (two) times daily.   Yes Historical Provider, MD  Multiple Vitamins-Minerals (PRESERVISION AREDS 2 PO) Take 1 tablet by mouth 2 (two) times daily.   Yes Historical Provider, MD  polyethylene glycol (MIRALAX / GLYCOLAX) packet Take 17 g by mouth daily as needed for mild constipation.    Yes Historical Provider, MD  tiotropium (SPIRIVA) 18 MCG inhalation capsule Place 18 mcg into inhaler and inhale every evening.    Yes Historical Provider, MD  triamcinolone cream (KENALOG) 0.1 % Apply 1 application topically 2 (two) times daily. For winter itch.   Yes Historical Provider, MD     Allergies:  Allergies  Allergen Reactions  . Albuterol Palpitations  . Sertraline Nausea Only and Other (See Comments)    Shaking and disorented    Social History:   reports that he has quit smoking. He does not have any smokeless tobacco history on file. He reports that he does not drink alcohol or use  illicit drugs.  Family History: History reviewed. No pertinent family history.   Physical Exam: Filed Vitals:   12/08/13 1715 12/08/13 1913 12/08/13 2216 12/09/13 0015  BP: 172/73 186/77 172/68 202/85  Pulse: 70 71 75 75  Temp:  97.6 F (36.4 C) 98.5 F (36.9 C) 97.9 F (36.6 C)  TempSrc:  Oral Oral Oral  Resp: '18 18 18 18  ' Height:    5' 11.5" (1.816 m)  Weight:    78.79 kg (173 lb 11.2 oz)  SpO2: 92% 95% 98% 99%   Blood pressure 202/85, pulse 75, temperature 97.9 F (36.6 C), temperature source Oral, resp. rate 18, height 5' 11.5" (1.816 m), weight 78.79 kg (173 lb 11.2 oz), SpO2 99.00%.  GEN:  Pleasant  patient lying in the stretcher in no acute distress; cooperative with exam. PSYCH:  alert and oriented x4; does not appear anxious or depressed; affect is appropriate. HEENT: Mucous membranes pink and anicteric; PERRLA; EOM intact; no cervical lymphadenopathy  nor thyromegaly or carotid bruit; no JVD; There were no stridor. Neck is very supple. Breasts:: Not examined CHEST WALL: No tenderness CHEST: Normal respiration, clear to auscultation bilaterally.  HEART: Regular rate and rhythm.  There are no murmur, rub, or gallops.   BACK: No kyphosis or scoliosis; no CVA tenderness ABDOMEN: soft and non-tender; no masses, no organomegaly, normal abdominal bowel sounds; no pannus; no intertriginous candida. There is no rebound and no distention. Rectal Exam: Not done EXTREMITIES: No bone or joint deformity; age-appropriate arthropathy of the hands and knees; no edema; no ulcerations.  There is no calf tenderness. Genitalia: not examined PULSES: 2+ and symmetric SKIN: Normal hydration no rash or ulceration CNS: Cranial nerves 2-12 grossly intact no focal lateralizing neurologic deficit.  Speech is fluent; uvula elevated with phonation, facial symmetry and tongue midline. DTR are normal bilaterally, cerebella exam is intact, barbinski is negative and strengths are equaled bilaterally.  No  sensory loss.   Labs on Admission:  Basic Metabolic Panel:  Recent Labs Lab 12/08/13 1803  NA 133*  K 5.2  CL 97  CO2 22  GLUCOSE 112*  BUN 23  CREATININE 1.44*  CALCIUM 8.7   Liver Function Tests:  Recent Labs Lab 12/08/13 1803  AST 17  ALT 14  ALKPHOS 166*  BILITOT 0.5  PROT 6.4  ALBUMIN 3.3*   No results found for this basename: LIPASE, AMYLASE,  in the last 168 hours No results found for this basename: AMMONIA,  in the last 168 hours CBC:  Recent Labs Lab 12/08/13 1803  WBC 11.4*  NEUTROABS 8.2*  HGB 12.7*  HCT 36.9*  MCV 91.8  PLT 154   Cardiac Enzymes: No results found for this basename: CKTOTAL, CKMB, CKMBINDEX, TROPONINI,  in the last 168 hours  CBG: No results found for this basename: GLUCAP,  in the last 168 hours   Radiological Exams on Admission: US Abdomen Complete  12/08/2013   CLINICAL DATA:  80 year old male with abdominal pain and urinary retention.  EXAM: ULTRASOUND ABDOMEN COMPLETE  COMPARISON:  None.  FINDINGS: Gallbladder:  Multiple mobile gallstones are identified, the largest measuring 8 mm. There is no evidence of gallbladder wall thickening, pericholecystic fluid or sonographic Murphy sign.  Common bile duct:  Diameter: 7 mm. There is no evidence of intrahepatic or extrahepatic biliary dilatation.  Liver:  No focal lesion identified. Within normal limits in parenchymal echogenicity.  IVC:  Not well visualized secondary to overlying bowel gas.  Pancreas:  Visualized portion unremarkable but much of the pancreas is obscured by overlying bowel gas.  Spleen:  Size and appearance within normal limits.  Right Kidney:  Length: 8.7 cm with renal cortical thinning. Echogenicity within normal limits. No mass or hydronephrosis visualized.  Left Kidney:  Length: 9 cm with renal cortical thinning. Echogenicity within normal limits. No mass or hydronephrosis visualized.  Abdominal aorta:  Overlying bowel gas obscures some portions of the abdominal aorta.  There is no evidence of aneurysm.  Other findings:  None.  IMPRESSION: Cholelithiasis without sonographic evidence of acute cholecystitis. No evidence of biliary dilatation.  IVC, pancreas and portions of the abdominal aorta not well visualized.  No evidence of acute abnormality.   Electronically Signed   By: Hassan Rowan M.D.   On: 12/08/2013 19:07   Assessment/Plan Present on Admission:  . Pyelonephritis . Back pain . HTN (hypertension) . CAD (coronary artery disease) . CKD (chronic kidney disease), stage III . A-fib  PLAN:  He appears well.  His  low back pain has no leg radiation and no weakness.  I suspect it was from pyelonephritis.  His abdominal US was negative.  Will give IV antibiotic and admit him for obs.  I did discuss his code status and confirmed that he is a DNR.  He also has CKD and will follow his Cr carefully.  For his HTN, will continue his home meds.  Will use PRN IV Normadyne for his BP out of controlled.  Thank you for as asking me to participate in his care.  He will be admitted to Dr Patton Salles service as per prior arrangement.  Other plans as per orders.  Code Status: DNR.   Orvan Falconer, MD. [facility] Pager [phone] 7pm to 7am.  12/09/2013, 12:20 AM

## 2013-12-09 NOTE — Progress Notes (Signed)
Patient arrived on unit from ED. Patients BP was elevated at 202/85. Will give scheduled BP medication and recheck. Patient has a rash on bottom and groin, scattered bruising bilaterally to his arms, and an abrasion to his right wrist. Patient A&O. Pt is hard of hearing but able to answer questions. Pt uses hearing aid in left ear and wears glasses. Patient resting comfortably. Will continue to monitor.

## 2013-12-10 DIAGNOSIS — J441 Chronic obstructive pulmonary disease with (acute) exacerbation: Secondary | ICD-10-CM | POA: Diagnosis present

## 2013-12-10 DIAGNOSIS — K5900 Constipation, unspecified: Secondary | ICD-10-CM | POA: Diagnosis present

## 2013-12-10 LAB — CBC WITH DIFFERENTIAL/PLATELET
BASOS PCT: 0 % (ref 0–1)
Basophils Absolute: 0 10*3/uL (ref 0.0–0.1)
Eosinophils Absolute: 0 10*3/uL (ref 0.0–0.7)
Eosinophils Relative: 0 % (ref 0–5)
HCT: 33.8 % — ABNORMAL LOW (ref 39.0–52.0)
Hemoglobin: 11.9 g/dL — ABNORMAL LOW (ref 13.0–17.0)
Lymphocytes Relative: 10 % — ABNORMAL LOW (ref 12–46)
Lymphs Abs: 0.8 10*3/uL (ref 0.7–4.0)
MCH: 31.7 pg (ref 26.0–34.0)
MCHC: 35.2 g/dL (ref 30.0–36.0)
MCV: 90.1 fL (ref 78.0–100.0)
MONOS PCT: 3 % (ref 3–12)
Monocytes Absolute: 0.2 10*3/uL (ref 0.1–1.0)
Neutro Abs: 6.9 10*3/uL (ref 1.7–7.7)
Neutrophils Relative %: 87 % — ABNORMAL HIGH (ref 43–77)
PLATELETS: 144 10*3/uL — AB (ref 150–400)
RBC: 3.75 MIL/uL — ABNORMAL LOW (ref 4.22–5.81)
RDW: 13.8 % (ref 11.5–15.5)
WBC: 7.9 10*3/uL (ref 4.0–10.5)

## 2013-12-10 LAB — BASIC METABOLIC PANEL
BUN: 20 mg/dL (ref 6–23)
CO2: 22 mEq/L (ref 19–32)
Calcium: 8.6 mg/dL (ref 8.4–10.5)
Chloride: 98 mEq/L (ref 96–112)
Creatinine, Ser: 1.11 mg/dL (ref 0.50–1.35)
GFR calc non Af Amer: 55 mL/min — ABNORMAL LOW (ref >90)
GFR, EST AFRICAN AMERICAN: 64 mL/min — AB (ref >90)
GLUCOSE: 152 mg/dL — AB (ref 70–99)
POTASSIUM: 4.4 meq/L (ref 3.7–5.3)
Sodium: 135 mEq/L — ABNORMAL LOW (ref 137–147)

## 2013-12-10 MED ORDER — BISACODYL 10 MG RE SUPP
10.0000 mg | Freq: Every day | RECTAL | Status: DC | PRN
  Administered 2013-12-10 – 2013-12-11 (×2): 10 mg via RECTAL
  Filled 2013-12-10 (×2): qty 1

## 2013-12-10 MED ORDER — PREDNISONE 20 MG PO TABS
40.0000 mg | ORAL_TABLET | Freq: Every day | ORAL | Status: DC
  Administered 2013-12-11 – 2013-12-12 (×2): 40 mg via ORAL
  Filled 2013-12-10 (×4): qty 2

## 2013-12-10 MED ORDER — TAMSULOSIN HCL 0.4 MG PO CAPS
0.4000 mg | ORAL_CAPSULE | Freq: Every day | ORAL | Status: DC
  Administered 2013-12-10: 11:00:00 0.4 mg via ORAL
  Filled 2013-12-10: qty 1

## 2013-12-10 MED ORDER — LEVALBUTEROL HCL 0.63 MG/3ML IN NEBU
0.6300 mg | INHALATION_SOLUTION | Freq: Three times a day (TID) | RESPIRATORY_TRACT | Status: DC
  Administered 2013-12-10 – 2013-12-11 (×5): 0.63 mg via RESPIRATORY_TRACT
  Filled 2013-12-10 (×8): qty 3

## 2013-12-10 MED ORDER — TAMSULOSIN HCL 0.4 MG PO CAPS
0.8000 mg | ORAL_CAPSULE | Freq: Every day | ORAL | Status: DC
  Administered 2013-12-11 – 2013-12-15 (×5): 0.8 mg via ORAL
  Filled 2013-12-10 (×5): qty 2

## 2013-12-10 NOTE — Progress Notes (Signed)
Pt was bladder scanned and had 316 in bladder. Richarda Overlie Rn 12-10-2013 19:50

## 2013-12-10 NOTE — Progress Notes (Addendum)
Subjective: Overall John [name] is feeling much better today. Shortness of breath has resolved. Abdominal pain and distention has resolved. He had urinary retention yesterday and also after workup, shortness of breath was found to be a COPD exacerbation. Urinary tract infection could have been a prostatitis. Biggest complaint today is one of constipation, no bowel movement in 4-5 days. Would like to go home today but Foley catheter is still in place and he has not had an evaluation by physical therapy etc.  Objective: Weight change:   Intake/Output Summary (Last 24 hours) at 12/10/13 0948 Last data filed at 12/10/13 0817  Gross per 24 hour  Intake 1078.33 ml  Output   2350 ml  Net -1271.67 ml   Filed Vitals:   12/09/13 2035 12/09/13 2159 12/09/13 2215 12/10/13 0414  BP:  198/74 176/78 182/75  Pulse:  96  84  Temp:  97.4 F (36.3 C)  98.3 F (36.8 C)  TempSrc:  Oral  Oral  Resp:  20  22  Height:      Weight:      SpO2: 95% 95%  94%     General Appearance: Alert, very pleasant, cooperative, complaining of constipation, no longer short of breath or complaining of abdominal pain. Very hard of hearing and has hearing aids Lungs: Distant breath sounds bilaterally with no wheezing or rales Heart: Regular rate and rhythm, S1 and S2 normal, no murmur, rub or gallop  Abdomen: Nondistended and non-tender in the lower abdominal region with normal bowel sounds - Foley catheter in place Extremities: Extremities normal, atraumatic, no cyanosis or edema  Neuro: Alert and oriented, nonfocal   Lab Results: Results for orders placed during the hospital encounter of 12/08/13 (from the past 48 hour(s))  URINALYSIS, ROUTINE W REFLEX MICROSCOPIC     Status: Abnormal   Collection Time    12/08/13  5:40 PM      Result Value Range   Color, Urine YELLOW  YELLOW   APPearance CLOUDY (*) CLEAR   Specific Gravity, Urine 1.018  1.005 - 1.030   pH 5.5  5.0 - 8.0   Glucose, UA NEGATIVE  NEGATIVE mg/dL    Hgb urine dipstick SMALL (*) NEGATIVE   Bilirubin Urine NEGATIVE  NEGATIVE   Ketones, ur NEGATIVE  NEGATIVE mg/dL   Protein, ur NEGATIVE  NEGATIVE mg/dL   Urobilinogen, UA 0.2  0.0 - 1.0 mg/dL   Nitrite POSITIVE (*) NEGATIVE   Leukocytes, UA LARGE (*) NEGATIVE  URINE MICROSCOPIC-ADD ON     Status: Abnormal   Collection Time    12/08/13  5:40 PM      Result Value Range   WBC, UA TOO NUMEROUS TO COUNT  <3 WBC/hpf   RBC / HPF 3-6  <3 RBC/hpf   Bacteria, UA MANY (*) RARE   Casts HYALINE CASTS (*) NEGATIVE   Urine-Other AMORPHOUS URATES/PHOSPHATES    CBC WITH DIFFERENTIAL     Status: Abnormal   Collection Time    12/08/13  6:03 PM      Result Value Range   WBC 11.4 (*) 4.0 - 10.5 K/uL   RBC 4.02 (*) 4.22 - 5.81 MIL/uL   Hemoglobin 12.7 (*) 13.0 - 17.0 g/dL   HCT 36.9 (*) 39.0 - 52.0 %   MCV 91.8  78.0 - 100.0 fL   MCH 31.6  26.0 - 34.0 pg   MCHC 34.4  30.0 - 36.0 g/dL   RDW 14.0  11.5 - 15.5 %   Platelets 154  150 -  400 K/uL   Neutrophils Relative % 72  43 - 77 %   Neutro Abs 8.2 (*) 1.7 - 7.7 K/uL   Lymphocytes Relative 16  12 - 46 %   Lymphs Abs 1.8  0.7 - 4.0 K/uL   Monocytes Relative 11  3 - 12 %   Monocytes Absolute 1.2 (*) 0.1 - 1.0 K/uL   Eosinophils Relative 2  0 - 5 %   Eosinophils Absolute 0.2  0.0 - 0.7 K/uL   Basophils Relative 0  0 - 1 %   Basophils Absolute 0.0  0.0 - 0.1 K/uL  COMPREHENSIVE METABOLIC PANEL     Status: Abnormal   Collection Time    12/08/13  6:03 PM      Result Value Range   Sodium 133 (*) 137 - 147 mEq/L   Potassium 5.2  3.7 - 5.3 mEq/L   Chloride 97  96 - 112 mEq/L   CO2 22  19 - 32 mEq/L   Glucose, Bld 112 (*) 70 - 99 mg/dL   BUN 23  6 - 23 mg/dL   Creatinine, Ser 1.44 (*) 0.50 - 1.35 mg/dL   Calcium 8.7  8.4 - 10.5 mg/dL   Total Protein 6.4  6.0 - 8.3 g/dL   Albumin 3.3 (*) 3.5 - 5.2 g/dL   AST 17  0 - 37 U/L   ALT 14  0 - 53 U/L   Alkaline Phosphatase 166 (*) 39 - 117 U/L   Total Bilirubin 0.5  0.3 - 1.2 mg/dL   GFR calc non Af  Amer 40 (*) >90 mL/min   GFR calc Af Amer 46 (*) >90 mL/min   Comment: (NOTE)     The eGFR has been calculated using the CKD EPI equation.     This calculation has not been validated in all clinical situations.     eGFR's persistently <90 mL/min signify possible Chronic Kidney     Disease.  LACTIC ACID, PLASMA     Status: None   Collection Time    12/08/13  6:03 PM      Result Value Range   Lactic Acid, Venous 1.2  0.5 - 2.2 mmol/L  CBC     Status: Abnormal   Collection Time    12/09/13  1:33 AM      Result Value Range   WBC 8.8  4.0 - 10.5 K/uL   RBC 3.77 (*) 4.22 - 5.81 MIL/uL   Hemoglobin 11.9 (*) 13.0 - 17.0 g/dL   HCT 34.6 (*) 39.0 - 52.0 %   MCV 91.8  78.0 - 100.0 fL   MCH 31.6  26.0 - 34.0 pg   MCHC 34.4  30.0 - 36.0 g/dL   RDW 14.0  11.5 - 15.5 %   Platelets 153  150 - 400 K/uL  CREATININE, SERUM     Status: Abnormal   Collection Time    12/09/13  1:33 AM      Result Value Range   Creatinine, Ser 1.31  0.50 - 1.35 mg/dL   GFR calc non Af Amer 45 (*) >90 mL/min   GFR calc Af Amer 52 (*) >90 mL/min   Comment: (NOTE)     The eGFR has been calculated using the CKD EPI equation.     This calculation has not been validated in all clinical situations.     eGFR's persistently <90 mL/min signify possible Chronic Kidney     Disease.  PRO B NATRIURETIC PEPTIDE     Status:  Abnormal   Collection Time    12/09/13  1:33 AM      Result Value Range   Pro B Natriuretic peptide (BNP) 609.3 (*) 0 - 450 pg/mL  CBC WITH DIFFERENTIAL     Status: Abnormal   Collection Time    12/10/13  4:00 AM      Result Value Range   WBC 7.9  4.0 - 10.5 K/uL   RBC 3.75 (*) 4.22 - 5.81 MIL/uL   Hemoglobin 11.9 (*) 13.0 - 17.0 g/dL   HCT 33.8 (*) 39.0 - 52.0 %   MCV 90.1  78.0 - 100.0 fL   MCH 31.7  26.0 - 34.0 pg   MCHC 35.2  30.0 - 36.0 g/dL   RDW 13.8  11.5 - 15.5 %   Platelets 144 (*) 150 - 400 K/uL   Neutrophils Relative % 87 (*) 43 - 77 %   Neutro Abs 6.9  1.7 - 7.7 K/uL   Lymphocytes  Relative 10 (*) 12 - 46 %   Lymphs Abs 0.8  0.7 - 4.0 K/uL   Monocytes Relative 3  3 - 12 %   Monocytes Absolute 0.2  0.1 - 1.0 K/uL   Eosinophils Relative 0  0 - 5 %   Eosinophils Absolute 0.0  0.0 - 0.7 K/uL   Basophils Relative 0  0 - 1 %   Basophils Absolute 0.0  0.0 - 0.1 K/uL  BASIC METABOLIC PANEL     Status: Abnormal   Collection Time    12/10/13  4:00 AM      Result Value Range   Sodium 135 (*) 137 - 147 mEq/L   Potassium 4.4  3.7 - 5.3 mEq/L   Chloride 98  96 - 112 mEq/L   CO2 22  19 - 32 mEq/L   Glucose, Bld 152 (*) 70 - 99 mg/dL   BUN 20  6 - 23 mg/dL   Creatinine, Ser 1.11  0.50 - 1.35 mg/dL   Calcium 8.6  8.4 - 10.5 mg/dL   GFR calc non Af Amer 55 (*) >90 mL/min   GFR calc Af Amer 64 (*) >90 mL/min   Comment: (NOTE)     The eGFR has been calculated using the CKD EPI equation.     This calculation has not been validated in all clinical situations.     eGFR's persistently <90 mL/min signify possible Chronic Kidney     Disease.    Studies/Results: US Abdomen Complete  12/08/2013   CLINICAL DATA:  80 year old male with abdominal pain and urinary retention.  EXAM: ULTRASOUND ABDOMEN COMPLETE  COMPARISON:  None.  FINDINGS: Gallbladder:  Multiple mobile gallstones are identified, the largest measuring 8 mm. There is no evidence of gallbladder wall thickening, pericholecystic fluid or sonographic Murphy sign.  Common bile duct:  Diameter: 7 mm. There is no evidence of intrahepatic or extrahepatic biliary dilatation.  Liver:  No focal lesion identified. Within normal limits in parenchymal echogenicity.  IVC:  Not well visualized secondary to overlying bowel gas.  Pancreas:  Visualized portion unremarkable but much of the pancreas is obscured by overlying bowel gas.  Spleen:  Size and appearance within normal limits.  Right Kidney:  Length: 8.7 cm with renal cortical thinning. Echogenicity within normal limits. No mass or hydronephrosis visualized.  Left Kidney:  Length: 9 cm with  renal cortical thinning. Echogenicity within normal limits. No mass or hydronephrosis visualized.  Abdominal aorta:  Overlying bowel gas obscures some portions of the abdominal aorta.  There is no evidence of aneurysm.  Other findings:  None.  IMPRESSION: Cholelithiasis without sonographic evidence of acute cholecystitis. No evidence of biliary dilatation.  IVC, pancreas and portions of the abdominal aorta not well visualized.  No evidence of acute abnormality.   Electronically Signed   By: Hassan Rowan M.D.   On: 12/08/2013 19:07   Dg Chest Port 1 View  12/09/2013   CLINICAL DATA:  COPD exacerbation or shortness of breath.  EXAM: PORTABLE CHEST - 1 VIEW  COMPARISON:  Two-view chest x-ray 06/22/2013 [place], 04/18/2009 [facility].  FINDINGS: Prior sternotomy. Cardiac silhouette normal in size, unchanged. Thoracic aorta atherosclerotic, unchanged. Left subclavian dual lead transvenous pacemaker unchanged. Emphysematous changes in the upper lobes. Suboptimal inspiration accounts for slight crowding of the bronchovascular markings at the lung bases. Lungs otherwise clear. No localized airspace consolidation. No pleural effusions. No pneumothorax. Normal pulmonary vascularity.  IMPRESSION: COPD/emphysema. Suboptimal inspiration accounts for minimal bibasilar atelectasis. No acute cardiopulmonary disease otherwise.   Electronically Signed   By: Evangeline Dakin M.D.   On: 12/09/2013 11:49   Medications: Scheduled Meds: . amLODipine  5 mg Oral Daily  . aspirin  325 mg Oral Daily  . atorvastatin  10 mg Oral QPM  . buPROPion  75 mg Oral BID WC  . cefTRIAXone (ROCEPHIN)  IV  1 g Intravenous Q24H  . [START ON 12/14/2013] cyanocobalamin  1,000 mcg Intramuscular Q30 days  . docusate sodium  100 mg Oral BID  . fluticasone  1 puff Inhalation BID  . heparin  5,000 Units Subcutaneous Q8H  . levalbuterol  0.63 mg Nebulization Q8H  . loratadine  10 mg Oral Daily  . memantine  10 mg Oral BID  . metoprolol tartrate  25 mg  Oral BID  . multivitamin-lutein  1 capsule Oral Daily  . pantoprazole  40 mg Oral Daily  . [START ON 12/11/2013] predniSONE  40 mg Oral Q breakfast  . sodium chloride  3 mL Intravenous Q12H  . tamsulosin  0.4 mg Oral Daily  . tiotropium  18 mcg Inhalation QPM   Continuous Infusions: . dextrose 5 % and 0.9% NaCl 20 mL/hr at 12/10/13 0631   PRN Meds:.acetaminophen, bisacodyl, labetalol, levalbuterol  Assessment/Plan:  Principal Problem:  Pyelonephritis/prostatitis - suspect this may be more prostatitis and pyelonephritis with acute urinary retention. Followup on urinary culture and plan to switch to by mouth antibiotics in the morning, January 12  Active Problems: COPD exacerbation - breathing much better today after breathing treatment started and IV Solu-Medrol x1. Will start prednisone 40 mg daily and plan for a quick taper Constipation - try Dulcolax suppository and if not effective, fleets enema Back pain - likely from prostatitis and/or urinary retention - pain is much improved with placement of Foley catheter HTN (hypertension) - controlled  CAD (coronary artery disease) - no complaints of chest pain  CKD (chronic kidney disease), stage III  A-fib - on aspirin. He is not a Coumadin candidate  Shortness of breath - much improved with treatment of COPD Acute urinary retention - plan to DC Foley catheter at 2 PM today. Start Flomax  Hearing loss - severe hearing loss - aware Activity - out of bed to chair Disposition - start PT/OT and hopefully home in the next couple of days - would benefit from home health nursing and physical therapy   LOS: 2 days   Henrine Screws, MD 12/10/2013, 9:48 AM

## 2013-12-10 NOTE — Progress Notes (Signed)
Pt was attempting to get out of bed and hit arm on side rail creating a skin tear on his right lower forearm. Pt assessed and made comfortable back in bed. Pink foam dressing applied to new skin tear.

## 2013-12-10 NOTE — Progress Notes (Addendum)
Pt complained of lower abd pain. Foley catheter removed at 1049, bladder scan at 1430 showed 15mL in bladder. Will continue to assess urine output and if there is urinary retention.   1515: Pt stated he felt the need to have a bowel movement following Dulcolax suppository. Pt passed a moderate sized, clear, mucous, bowel movement. It did not appear to have stool present.   5:34 PM Pt's bladder scan result 260mL. Pt has not had post void following foley catheter removal. MD notified

## 2013-12-11 ENCOUNTER — Inpatient Hospital Stay (HOSPITAL_COMMUNITY): Payer: Medicare Other

## 2013-12-11 ENCOUNTER — Encounter: Payer: Medicare Other | Admitting: Internal Medicine

## 2013-12-11 MED ORDER — LEVALBUTEROL HCL 0.63 MG/3ML IN NEBU
0.6300 mg | INHALATION_SOLUTION | Freq: Four times a day (QID) | RESPIRATORY_TRACT | Status: DC | PRN
  Administered 2013-12-15: 0.63 mg via RESPIRATORY_TRACT

## 2013-12-11 MED ORDER — NYSTATIN 100000 UNIT/GM EX POWD
Freq: Two times a day (BID) | CUTANEOUS | Status: DC
  Administered 2013-12-11 – 2013-12-15 (×9): via TOPICAL
  Filled 2013-12-11 (×2): qty 15

## 2013-12-11 NOTE — Progress Notes (Addendum)
Assessment/Plan: Principal Problem:   Pyelonephritis - presumed dx with back pain and pyuria. Denies dysuria. Coag negative staph in culture. Sensitivities pending.  Active Problems:   Back pain - see above   HTN (hypertension)   CAD (coronary artery disease)   CKD (chronic kidney disease), stage III   A-fib   Shortness of breath   Acute urinary retention - he has been able to void, but not sure how much he is emptying his bladder. Will check PVR by bladder scan   Hearing loss   COPD exacerbation   Unspecified constipation - he is quite distended and tympanitic. Check xray today.    Rash - probably fungal. Add antifungal powder.    Subjective: Feels out of sorts but nothing specific. Note that he had catheter yesterday. Does look like he has been able to void overnight. Denies pain with urination. C/o constipation. Did have some mucous passage after he had a laxative.    Objective:  Vital Signs: Filed Vitals:   12/10/13 2017 12/10/13 2018 12/10/13 2155 12/11/13 0354  BP:   151/60 166/75  Pulse:   83 87  Temp:   97.8 F (36.6 C) 98.1 F (36.7 C)  TempSrc:   Oral Oral  Resp:   20 20  Height:      Weight:      SpO2: 96% 96% 96% 92%     EXAM: Abdomen distended and tympanitic.   Skin: diffuse rash on both buttocks and lower back   Intake/Output Summary (Last 24 hours) at 12/11/13 0731 Last data filed at 12/11/13 0600  Gross per 24 hour  Intake 919.67 ml  Output   1651 ml  Net -731.33 ml    Lab Results:  Recent Labs  12/08/13 1803 12/09/13 0133 12/10/13 0400  NA 133*  --  135*  K 5.2  --  4.4  CL 97  --  98  CO2 22  --  22  GLUCOSE 112*  --  152*  BUN 23  --  20  CREATININE 1.44* 1.31 1.11  CALCIUM 8.7  --  8.6    Recent Labs  12/08/13 1803  AST 17  ALT 14  ALKPHOS 166*  BILITOT 0.5  PROT 6.4  ALBUMIN 3.3*   No results found for this basename: LIPASE, AMYLASE,  in the last 72 hours  Recent Labs  12/08/13 1803 12/09/13 0133 12/10/13 0400   WBC 11.4* 8.8 7.9  NEUTROABS 8.2*  --  6.9  HGB 12.7* 11.9* 11.9*  HCT 36.9* 34.6* 33.8*  MCV 91.8 91.8 90.1  PLT 154 153 144*   No results found for this basename: CKTOTAL, CKMB, CKMBINDEX, TROPONINI,  in the last 72 hours BNP    Component Value Date/Time   PROBNP 609.3* 12/09/2013 0133   No results found for this basename: DDIMER,  in the last 72 hours No results found for this basename: HGBA1C,  in the last 72 hours No results found for this basename: CHOL, HDL, LDLCALC, TRIG, CHOLHDL, LDLDIRECT,  in the last 72 hours No results found for this basename: TSH, T4TOTAL, FREET3, T3FREE, THYROIDAB,  in the last 72 hours No results found for this basename: VITAMINB12, FOLATE, FERRITIN, TIBC, IRON, RETICCTPCT,  in the last 72 hours  Studies/Results: Dg Chest Port 1 View  12/09/2013   CLINICAL DATA:  COPD exacerbation or shortness of breath.  EXAM: PORTABLE CHEST - 1 VIEW  COMPARISON:  Two-view chest x-ray 06/22/2013 [place], 04/18/2009 [facility].  FINDINGS: Prior sternotomy. Cardiac silhouette  normal in size, unchanged. Thoracic aorta atherosclerotic, unchanged. Left subclavian dual lead transvenous pacemaker unchanged. Emphysematous changes in the upper lobes. Suboptimal inspiration accounts for slight crowding of the bronchovascular markings at the lung bases. Lungs otherwise clear. No localized airspace consolidation. No pleural effusions. No pneumothorax. Normal pulmonary vascularity.  IMPRESSION: COPD/emphysema. Suboptimal inspiration accounts for minimal bibasilar atelectasis. No acute cardiopulmonary disease otherwise.   Electronically Signed   By: Evangeline Dakin M.D.   On: 12/09/2013 11:49   Medications: Medications administered in the last 24 hours reviewed.  Current Medication List reviewed.    LOS: 3 days   [facility] @ [facility] [phone]) 12/11/2013, 7:31 AM

## 2013-12-11 NOTE — Evaluation (Signed)
Physical Therapy Evaluation Patient Details Name: John [name] MRN: [id number] DOB: 1917-01-10 Today's Date: 12/11/2013 Time: 6967-8938 PT Time Calculation (min): 28 min  PT Assessment / Plan / Recommendation History of Present Illness  John [name] is a very pleasant elderly male with COPD, CAD, hypertension, hyperlipidemia, cardiovascular disease and claudication who presents to the emergency room with back pain. Pt found to have pyelonephritis.  Clinical Impression  Pt admitted with above. Pt currently with functional limitations due to the deficits listed below (see PT Problem List).  Pt will benefit from skilled PT to increase their independence and safety with mobility to allow discharge to the venue listed below. Do not feel pt can manage home alone at this time.      PT Assessment  Patient needs continued PT services    Follow Up Recommendations  SNF    Does the patient have the potential to tolerate intense rehabilitation      Barriers to Discharge Decreased caregiver support      Equipment Recommendations  None recommended by PT    Recommendations for Other Services     Frequency Min 3X/week    Precautions / Restrictions Precautions Precautions: Fall   Pertinent Vitals/Pain See flow sheet.      Mobility  Bed Mobility Overal bed mobility: Needs Assistance Bed Mobility: Supine to Sit Supine to sit: Mod assist;HOB elevated General bed mobility comments: Assist to bring trunk up. Transfers Overall transfer level: Needs assistance Equipment used: Rolling walker (2 wheeled) Transfers: Sit to/from Stand Sit to Stand: Mod assist General transfer comment: Assist to bring hips up and for balance.  Pt with posterior lean. Ambulation/Gait Ambulation/Gait assistance: Mod assist Ambulation Distance (Feet): 10 Feet (x 2) Assistive device: Rolling walker (2 wheeled) Gait Pattern/deviations: Decreased step length - right;Decreased step length - left;Shuffle;Staggering  left;Staggering right;Trunk flexed General Gait Details: Pt very unsteady and fatigues quickly. Verbal cues to stay closer to walker and to stand more erect.    Exercises     PT Diagnosis: Difficulty walking;Generalized weakness  PT Problem List: Decreased strength;Decreased activity tolerance;Decreased balance;Decreased mobility;Decreased knowledge of use of DME;Decreased safety awareness;Decreased knowledge of precautions PT Treatment Interventions: DME instruction;Gait training;Patient/family education;Functional mobility training;Therapeutic activities;Therapeutic exercise;Balance training     PT Goals(Current goals can be found in the care plan section) Acute Rehab PT Goals Patient Stated Goal: Pt didn't state. PT Goal Formulation: With patient Time For Goal Achievement: 12/18/13 Potential to Achieve Goals: Good  Visit Information  Last PT Received On: 12/11/13 Assistance Needed: +1 History of Present Illness: John [name] is a very pleasant elderly male with COPD, CAD, hypertension, hyperlipidemia, cardiovascular disease and claudication who presents to the emergency room with back pain. Pt found to have pyelonephritis.       Prior [place] expects to be discharged to:: Private residence Living Arrangements: Alone Available Help at Discharge: Family;Available PRN/intermittently Type of Home: House Home Access: Stairs to enter [employer] of Steps: 2-3 Entrance Stairs-Rails: Right Home Layout: One level Home Equipment: Walker - 4 wheels;Electric scooter Prior Function Level of Independence: Independent with assistive device(s) Comments: Primarily uses scooter in his home.  Amb short distances with rollator.  Daughters help intermittently. Communication Communication: HOH    Cognition  Cognition Arousal/Alertness: Awake/alert Behavior During Therapy: WFL for tasks assessed/performed Overall Cognitive Status: No family/caregiver  present to determine baseline cognitive functioning    Extremity/Trunk Assessment Upper Extremity Assessment Upper Extremity Assessment: Generalized weakness Lower Extremity Assessment Lower Extremity Assessment: Generalized  weakness   Balance Balance Overall balance assessment: Needs assistance Standing balance support: Bilateral upper extremity supported Standing balance-Leahy Scale: Poor Standing balance comment: Pt with posterior lean in standing.  Stood with rolling walker x 30 seconds prior to gait.  End of Session PT - End of Session Equipment Utilized During Treatment: Gait belt Activity Tolerance: Patient limited by fatigue Patient left: in chair;with call bell/phone within reach;with chair alarm set Nurse Communication: Mobility status  GP     Burnice Oestreicher 12/11/2013, 2:03 PM  [facility] PT [phone]

## 2013-12-11 NOTE — Progress Notes (Signed)
Pt attempting to climb out of bed multiple times since shift has started. Safety sitter pending, currently unavailable. Family called spoke to [facility] who will come in to sit with him and is coming from [place]. Will continue to monitor and maintain safety. Darreld Mclean [facility]

## 2013-12-11 NOTE — Progress Notes (Signed)
Pt attempted to urinate, but could not start stream. Pt distended and complaining of urge to urinate. Pt bladder scanned and 567mL in bladder. MD notified.

## 2013-12-12 ENCOUNTER — Inpatient Hospital Stay (HOSPITAL_COMMUNITY): Payer: Medicare Other

## 2013-12-12 DIAGNOSIS — N390 Urinary tract infection, site not specified: Secondary | ICD-10-CM

## 2013-12-12 LAB — URINE CULTURE: Colony Count: >=100000

## 2013-12-12 MED ORDER — ONDANSETRON HCL 4 MG PO TABS: 4.0000 mg | ORAL_TABLET | Freq: Three times a day (TID) | ORAL | Status: DC | PRN

## 2013-12-12 MED ORDER — ONDANSETRON HCL 4 MG/2ML IJ SOLN
4.0000 mg | Freq: Three times a day (TID) | INTRAMUSCULAR | Status: DC | PRN
  Administered 2013-12-12 – 2013-12-14 (×4): 4 mg via INTRAVENOUS
  Filled 2013-12-12 (×4): qty 2

## 2013-12-12 MED ORDER — HALOPERIDOL LACTATE 5 MG/ML IJ SOLN
2.0000 mg | Freq: Four times a day (QID) | INTRAMUSCULAR | Status: DC | PRN
  Administered 2013-12-14: 2 mg via INTRAVENOUS
  Filled 2013-12-12: qty 1

## 2013-12-12 MED ORDER — VANCOMYCIN HCL IN DEXTROSE 1-5 GM/200ML-% IV SOLN
1000.0000 mg | INTRAVENOUS | Status: DC
  Administered 2013-12-12 – 2013-12-14 (×3): 1000 mg via INTRAVENOUS
  Filled 2013-12-12 (×4): qty 200

## 2013-12-12 MED ORDER — FLEET ENEMA 7-19 GM/118ML RE ENEM
1.0000 | ENEMA | Freq: Once | RECTAL | Status: AC
  Administered 2013-12-12: 11:00:00 1 via RECTAL
  Filled 2013-12-12: qty 1

## 2013-12-12 NOTE — Progress Notes (Signed)
Assessment/Plan: Principal Problem:   Pyelonephritis - culture came back with coag neg staph. Doubt this is right abx. Will ask ID for recommendation.  Active Problems:   Back pain   HTN (hypertension)   CAD (coronary artery disease)   CKD (chronic kidney disease), stage III   A-fib   Shortness of breath   Acute urinary retention - discussed with urology. They recommend Foley for at least 7-10 days with voiding trial as outpatient.    Hearing loss   COPD exacerbation - continues on ceftriaxone and prednisone. No wheezing at present. Some upper airway sounds after vomiting. Will recheck CXR.    Unspecified constipation - no results from enema. I will try another laxative tomorrow. No further efforts today. Did not have SBO picture yesterday but now has N/V. Will recheck KUB tomorrow if N/V persists.    Subjective: Feels very nauseated. Came on earlier today. Vomited dark material. See note from RN in regard to enema.   RN reports he was more congested in upper airway after his emesis.   Objective:  Vital Signs: Filed Vitals:   Dec 20, 2013 2222 12/12/13 0500 12/12/13 0746 12/12/13 1023  BP: 130/80 136/68  150/60  Pulse: 90 86  80  Temp:  98.4 F (36.9 C)    TempSrc:  Oral    Resp:  20    Height:      Weight:      SpO2:  96% 99%      EXAM: ABD: mild distension             LUNGS: clear lung fields. Upper airway congestion   Intake/Output Summary (Last 24 hours) at 12/12/13 1155 Last data filed at 12/12/13 0640  Gross per 24 hour  Intake 793.33 ml  Output    550 ml  Net 243.33 ml    Lab Results:  Recent Labs  12/10/13 0400  NA 135*  K 4.4  CL 98  CO2 22  GLUCOSE 152*  BUN 20  CREATININE 1.11  CALCIUM 8.6   No results found for this basename: AST, ALT, ALKPHOS, BILITOT, PROT, ALBUMIN,  in the last 72 hours No results found for this basename: LIPASE, AMYLASE,  in the last 72 hours  Recent Labs  12/10/13 0400  WBC 7.9  NEUTROABS 6.9  HGB 11.9*  HCT 33.8*   MCV 90.1  PLT 144*   No results found for this basename: CKTOTAL, CKMB, CKMBINDEX, TROPONINI,  in the last 72 hours BNP    Component Value Date/Time   PROBNP 609.3* 12/09/2013 0133   No results found for this basename: DDIMER,  in the last 72 hours No results found for this basename: HGBA1C,  in the last 72 hours No results found for this basename: CHOL, HDL, LDLCALC, TRIG, CHOLHDL, LDLDIRECT,  in the last 72 hours No results found for this basename: TSH, T4TOTAL, FREET3, T3FREE, THYROIDAB,  in the last 72 hours No results found for this basename: VITAMINB12, FOLATE, FERRITIN, TIBC, IRON, RETICCTPCT,  in the last 72 hours  Studies/Results: Dg Abd 2 Views  12-20-2013   CLINICAL DATA:  Abdominal pain and distention  EXAM: ABDOMEN - 2 VIEW  COMPARISON:  Supine abdominal film of June 22, 2013  FINDINGS: Supine and left-side-down decubitus abdominal films reveal a moderate amount of gas within the colon. There is considerable stool present as well. There is no evidence of a small bowel obstruction. There is gas and stool in the region of the rectum. No free extraluminal gas collections  are demonstrated. There are metallic seed implants at the level of the prostatic bed. There are calcifications within the walls of the common femoral arteries.  IMPRESSION: The bowel gas pattern that may reflect an element of constipation. There is no evidence of ileus nor obstruction. No free extraluminal gas collections are demonstrated either.   Electronically Signed   By: David  Martinique   On: 12/11/2013 11:50   Medications: Medications administered in the last 24 hours reviewed.  Current Medication List reviewed.    LOS: 4 days   Ms [facility] @ [facility] [phone]) 12/12/2013, 11:55 AM

## 2013-12-12 NOTE — Consult Note (Signed)
[facility]  Total days of antibiotics 5        Day 5 ceftriaxone        Day 1 vancomycin               Reason for Consult: CoNS uti in male    Referring Physician: Maxwell Caul  Principal Problem:   Pyelonephritis Active Problems:   Back pain   HTN (hypertension)   CAD (coronary artery disease)   CKD (chronic kidney disease), stage III   A-fib   Shortness of breath   Acute urinary retention   Hearing loss   COPD exacerbation   Unspecified constipation    HPI: John [name] is a 80 y.o. male with pmhx of COPD, CAD s/p pacemaker, hypertension, hyperlipidemia, cardiovascular disease and claudication who presents to the emergency room with back pain. He was found to have UA+, with TMTC pyuria and an elevated white count. Ultrasound of the abdomen did not really feel any pyelonephritis but he is complaining of shortness of breath currently and has a bladder scan with more than 1000 cc of urine thus requiringcatheterized momentarily and started on ceftriaxone for presumed UTI but urine cx showing 100,000 CoNS. Also being treated for COPD exacerbation.remains afebrile. Patient is nonverbal, history obtained through chart review   Past Medical History  Diagnosis Date  . COPD (chronic obstructive pulmonary disease)   . Hypertension   . High cholesterol   . MI (myocardial infarction)   . Chronic kidney disease (CKD) stage G3a/A1, moderately decreased glomerular filtration rate (GFR) between 45-59 mL/min/1.73 square meter and albuminuria creatinine ratio less than 30 mg/g   . Arrhythmia   . Atrial fibrillation   . Occlusion and stenosis of carotid artery without mention of cerebral infarction   . Urinary incontinence   . Colon polyps     1999-adenomas; 2002-hyperplastic polyp  . Cancer     Prostate. Seed implants 1994; F/B Dr. Rosana Hoes  . Tachycardia-bradycardia syndrome     With DDD pacemaker 1998  . Coronary artery disease     CABG 1994, LIMA to LAD,  sequential SVG to 1st and 2nd OM, sequential SVG to PDA and PL. Atretic LIMA and BMS LAD 2004  . Coronary atherosclerosis of native coronary artery   . Aortic valve sclerosis   . Ingrown toenail 2011  . Dysphagia     Due to esophageal dysmotility  . Memory loss   . Flank pain   . Esophageal dysmotility     Allergies:  Allergies  Allergen Reactions  . Albuterol Palpitations  . Sertraline Nausea Only and Other (See Comments)    Shaking and disorented     MEDICATIONS: . amLODipine  5 mg Oral Daily  . aspirin  325 mg Oral Daily  . atorvastatin  10 mg Oral QPM  . buPROPion  75 mg Oral BID WC  . cefTRIAXone (ROCEPHIN)  IV  1 g Intravenous Q24H  . [START ON 12/14/2013] cyanocobalamin  1,000 mcg Intramuscular Q30 days  . docusate sodium  100 mg Oral BID  . fluticasone  1 puff Inhalation BID  . heparin  5,000 Units Subcutaneous Q8H  . loratadine  10 mg Oral Daily  . memantine  10 mg Oral BID  . metoprolol tartrate  25 mg Oral BID  . multivitamin-lutein  1 capsule Oral Daily  . nystatin   Topical BID  . pantoprazole  40 mg Oral Daily  . predniSONE  40 mg Oral Q breakfast  .  sodium chloride  3 mL Intravenous Q12H  . tamsulosin  0.8 mg Oral Daily  . tiotropium  18 mcg Inhalation QPM    History  Substance Use Topics  . Smoking status: Former Research scientist (life sciences)  . Smokeless tobacco: Not on file  . Alcohol Use: No    History reviewed. No pertinent family history.  Review of Systems - Unable to obtain due to delirium  OBJECTIVE: Temp:  [98.3 F (36.8 C)-98.4 F (36.9 C)] 98.4 F (36.9 C) (01/13 0500) Pulse Rate:  [80-93] 83 (01/13 1326) Resp:  [20] 20 (01/13 1326) BP: (130-181)/(60-80) 181/69 mmHg (01/13 1326) SpO2:  [90 %-99 %] 99 % (01/13 1326)  GEN: elderly male, laying on his side occasional moaning, does not respond to questions HEENT: dry membranes pink and anicteric; PERRLA; EOM intact; no cervical lymphadenopathy nor thyromegaly or carotid bruit; no JVD; There were no  stridor. Neck is very supple.  Breasts:: Not examined  CHEST WALL: No tenderness  CHEST: Normal respiration, clear to auscultation bilaterally.  HEART: Regular rate and rhythm. There are no murmur, rub, or gallops.  BACK: No kyphosis or scoliosis; no CVA tenderness  ABDOMEN: soft and non-tender; no masses, no organomegaly, normal abdominal bowel sounds; no pannus; no rash, scattered echymosis from heparin shots. There is no rebound and no distention.  EXTREMITIES: No bone or joint deformity; age-appropriate arthropathy of the hands and knees; no edema; no ulcerations. There is no calf tenderness.  PULSES: 2+ and symmetric  SKIN: Normal hydration no rash or ulceration   LABS: No results found for this or any previous visit (from the past 48 hour(s)). Lab Results  Component Value Date   WBC 7.9 12/10/2013   HGB 11.9* 12/10/2013   HCT 33.8* 12/10/2013   MCV 90.1 12/10/2013   PLT 144* 12/10/2013   BMET    Component Value Date/Time   NA 135* 12/10/2013 0400   K 4.4 12/10/2013 0400   CL 98 12/10/2013 0400   CO2 22 12/10/2013 0400   GLUCOSE 152* 12/10/2013 0400   BUN 20 12/10/2013 0400   CREATININE 1.11 12/10/2013 0400   CALCIUM 8.6 12/10/2013 0400   GFRNONAA 55* 12/10/2013 0400   GFRAA 64* 12/10/2013 0400     UA + MICRO: 12/08/13 urine cx CoNS IMAGING: Dg Chest Port 1 View  12/12/2013   CLINICAL DATA:  Congestion with possible aspiration  EXAM: PORTABLE CHEST - 1 VIEW  COMPARISON:  Portable chest x-ray of December 09, 2013  FINDINGS: The lungs are reasonably well inflated. There is no focal infiltrate. The cardiac silhouette is normal in size. The mediastinum is normal in width. The knee patient has undergone previous median sternotomy. A permanent pacemaker is in place. The observed portions of the bony thorax exhibit no acute abnormalities. There are degenerative changes of the AC joints bilaterally.  IMPRESSION: There is no evidence of pneumonia nor atelectasis or CHF. No other acute  cardiopulmonary abnormality is demonstrated.   Electronically Signed   By: David  Martinique   On: 12/12/2013 13:09   Dg Abd 2 Views  12/11/2013   CLINICAL DATA:  Abdominal pain and distention  EXAM: ABDOMEN - 2 VIEW  COMPARISON:  Supine abdominal film of June 22, 2013  FINDINGS: Supine and left-side-down decubitus abdominal films reveal a moderate amount of gas within the colon. There is considerable stool present as well. There is no evidence of a small bowel obstruction. There is gas and stool in the region of the rectum. No free extraluminal gas collections  are demonstrated. There are metallic seed implants at the level of the prostatic bed. There are calcifications within the walls of the common femoral arteries.  IMPRESSION: The bowel gas pattern that may reflect an element of constipation. There is no evidence of ileus nor obstruction. No free extraluminal gas collections are demonstrated either.   Electronically Signed   By: David  Martinique   On: 12/11/2013 11:50    HISTORICAL MICRO/IMAGING  Assessment/Plan:  80yo M presented with back pain found to have UTI with CoNS.   - recommend to discontinue ceftriaxone and treat for CoNS uti with vancomycin x 7-10days  - if he has fever, recommend to get 2 sets of blood cultures to see if any evidence of bacteremia  Jamer[facility] Snavely [facility] [phone]

## 2013-12-12 NOTE — Progress Notes (Signed)
ANTIBIOTIC CONSULT NOTE - INITIAL  Pharmacy Consult for Vancomycin Indication: Coag neg. Staph UTI  Allergies  Allergen Reactions  . Albuterol Palpitations  . Sertraline Nausea Only and Other (See Comments)    Shaking and disorented   Patient Measurements: Height: 5' 11.5" (181.6 cm) Weight: 173 lb 11.2 oz (78.79 kg) IBW/kg (Calculated) : 76.45  Vital Signs: Temp: 98 F (36.7 C) (01/13 1430) Temp src: Axillary (01/13 1430) BP: 130/80 mmHg (01/13 1430) Pulse Rate: 80 (01/13 1430) Intake/Output from previous day: 01/12 0701 - 01/13 0700 In: 1153.3 [P.O.:610; I.V.:493.3; IV Piggyback:50] Out: 950 [Urine:950]  Labs:  Recent Labs  12/10/13 0400  WBC 7.9  HGB 11.9*  PLT 144*  CREATININE 1.11   Estimated Creatinine Clearance: 44 ml/min (by C-G formula based on Cr of 1.11).  Microbiology: Recent Results (from the past 720 hour(s))  URINE CULTURE     Status: None   Collection Time    12/08/13  5:40 PM      Result Value Range Status   Specimen Description URINE, CLEAN CATCH   Final   Special Requests NONE   Final   Culture  Setup Time     Final   Value: 12/09/2013 00:28     Performed at [place]     Final   Value: >=100,000 COLONIES/ML     Performed at [facility]   Culture     Final   Value: STAPHYLOCOCCUS SPECIES (COAGULASE NEGATIVE)     Note: RIFAMPIN AND GENTAMICIN SHOULD NOT BE USED AS SINGLE DRUGS FOR TREATMENT OF STAPH INFECTIONS.     Performed at [facility]   Report Status 12/12/2013 FINAL   Final   Organism ID, Bacteria STAPHYLOCOCCUS SPECIES (COAGULASE NEGATIVE)   Final   Medical History: Past Medical History  Diagnosis Date  . COPD (chronic obstructive pulmonary disease)   . Hypertension   . High cholesterol   . MI (myocardial infarction)   . Chronic kidney disease (CKD) stage G3a/A1, moderately decreased glomerular filtration rate (GFR) between 45-59 mL/min/1.73 square meter and albuminuria creatinine  ratio less than 30 mg/g   . Arrhythmia   . Atrial fibrillation   . Occlusion and stenosis of carotid artery without mention of cerebral infarction   . Urinary incontinence   . Colon polyps     1999-adenomas; 2002-hyperplastic polyp  . Cancer     Prostate. Seed implants 1994; F/B Dr. Rosana Hoes  . Tachycardia-bradycardia syndrome     With DDD pacemaker 1998  . Coronary artery disease     CABG 1994, LIMA to LAD, sequential SVG to 1st and 2nd OM, sequential SVG to PDA and PL. Atretic LIMA and BMS LAD 2004  . Coronary atherosclerosis of native coronary artery   . Aortic valve sclerosis   . Ingrown toenail 2011  . Dysphagia     Due to esophageal dysmotility  . Memory loss   . Flank pain   . Esophageal dysmotility    Medications:  Anti-infectives   Start     Dose/Rate Route Frequency Ordered Stop   12/09/13 1800  cefTRIAXone (ROCEPHIN) 1 g in dextrose 5 % 50 mL IVPB  Status:  Discontinued     1 g 100 mL/hr over 30 Minutes Intravenous Every 24 hours 12/09/13 0006 12/12/13 1500   12/08/13 1845  cefTRIAXone (ROCEPHIN) 1 g in dextrose 5 % 50 mL IVPB     1 g 100 mL/hr over 30 Minutes Intravenous  Once 12/08/13 1842  12/08/13 2018     Assessment: 80yo male with coag negative staph in the urine.  He has been on Ceftriaxone since 1/9.  He comes in with pyelonephritis, urinary retention for which he has a foley in place with plans to continue for a week.  He has several co-morbidities including chronic kidney disease.  His creatinine is the best since admit at 1.11 with an estimated crcl of 58ml/min.  His UOP is ok at 0.21ml/kg/hr.  His Pro-BNP was 609 and a lactic acid level os 1.2 on 1/9.  We have been asked to provide dosing with IV Vancomycin.    Goal of Therapy:  Vancomycin trough level 15-20 mcg/ml  Plan:  1.  Will begin 1gm. IV Vancomycin every 24 hours to give him an extended interval for clearance. 2.  Will check s/s levels and adjust if continued > 72 hours. 3.  Monitor renal function  and UOP for change and adjust as needed. 4.  F/u culture data and sensitivity and change IV antibiotics as appropriate.  Rober Minion, PharmD., MS Clinical Pharmacist Pager:  [phone] Thank you for allowing pharmacy to be part of this patients care team. 12/12/2013,3:26 PM

## 2013-12-12 NOTE — Clinical Social Work Placement (Signed)
Clinical Social Work Department CLINICAL SOCIAL WORK PLACEMENT NOTE 12/12/2013  Patient:  GERALD, KUEHL  Account Number:  [id number] Admit date:  12/08/2013  Clinical Social Worker:  Kemper Durie, [place]  Date/time:  12/12/2013 03:30 PM  Clinical Social Work is seeking post-discharge placement for this patient at the following level of care:   SKILLED NURSING   (*CSW will update this form in Epic as items are completed)   12/12/2013  Patient/family provided with [facility] Department of Clinical Social Work's list of facilities offering this level of care within the geographic area requested by the patient (or if unable, by the patient's family).  12/12/2013  Patient/family informed of their freedom to choose among providers that offer the needed level of care, that participate in Medicare, Medicaid or managed care program needed by the patient, have an available bed and are willing to accept the patient.  12/12/2013  Patient/family informed of [facility]' ownership interest in [facility], as well as of the fact that they are under no obligation to receive care at this facility.  PASARR submitted to EDS on 12/11/2013 PASARR number received from EDS on 12/11/2013  FL2 transmitted to all facilities in geographic area requested by pt/family on  12/12/2013 FL2 transmitted to all facilities within larger geographic area on   Patient informed that his/her managed care company has contracts with or will negotiate with  certain facilities, including the following:     Patient/family informed of bed offers received:   Patient chooses bed at  Physician recommends and patient chooses bed at    Patient to be transferred to  on   Patient to be transferred to facility by   The following physician request were entered in Epic:   Additional Comments:   Liz Beach, [place], [place], [phone]

## 2013-12-12 NOTE — Clinical Social Work Psychosocial (Signed)
Clinical Social Work Department BRIEF PSYCHOSOCIAL ASSESSMENT 12/12/2013  Patient:  John [name], John [name]     Account Number:  [id number]     Admit date:  12/08/2013  Clinical Social Worker:  Lovey Newcomer  Date/Time:  12/12/2013 03:30 PM  Referred by:  Physician  Date Referred:  12/12/2013 Referred for  SNF Placement   Other Referral:   Interview type:  Family Other interview type:   Patient not oriented at this time. CSW interviewed patient's HPOA John [name] (daughter).    PSYCHOSOCIAL DATA Living Status:  FAMILY Admitted from facility:   Level of care:   Primary support name:  [employer] Primary support relationship to patient:  CHILD, ADULT Degree of support available:   Support is adequate    CURRENT CONCERNS Current Concerns  Post-Acute Placement   Other Concerns:    SOCIAL WORK ASSESSMENT / PLAN CSW went to bedside to assess for SNF placement. Family not at bedside so CSW contacted patient's HPOA John [name]. John [name] is agreeable to SNF placement and believes that the patient needs the rehab. CSW explained how SNF search process is done and explained how authorization will need to be obtained from insurance company. CSW has started [employer], and will wait for authorization. John [name] states that would like patient to be placed in [place] or [place]. Preference is for [facility] in [place]. CSW will assist with placement.   Assessment/plan status:  Psychosocial Support/Ongoing Assessment of Needs Other assessment/ plan:   Complete FL2, Fax, PASRR, Blue Medicare Auth   Information/referral to community resources:   CSW contact information and SNF list left in patient's room.    PATIENT'S/FAMILY'S RESPONSE TO PLAN OF CARE: John [name] is agreeable to SNF placement. John [name] was pleasant, appropriate, and appreciative of CSW contact. John [name] is waiting for bed offers. CSW will continue to follow.     Liz Beach, [place], [place],  [phone]

## 2013-12-12 NOTE — Clinical Documentation Improvement (Signed)
THIS DOCUMENT IS NOT A PERMANENT PART OF THE MEDICAL RECORD  Please update your documentation with the medical record to reflect your response to this query. If you need help knowing how to do this please call [phone].  12/12/13  Dear Dr. Adah Salvage,  In a better effort to capture your patient's severity of illness, reflect appropriate length of stay and utilization of resources, a review of the patient medical record has revealed the following indicators the diagnosis of Heart Failure.    Based on your clinical judgment, please clarify TYPE and ACUITY of CHF and document in a progress note and/or discharge summary the clinical condition associated with the following supporting information:  In responding to this query please exercise your independent judgment.  The fact that a query is asked, does not imply that any particular answer is desired or expected.  Possible Clinical Conditions?  Chronic Systolic Congestive Heart Failure Chronic Diastolic Congestive Heart Failure Chronic Systolic & Diastolic Congestive Heart Failure Acute Systolic Congestive Heart Failure Acute Diastolic Congestive Heart Failure Acute Systolic & Diastolic Congestive Heart Failure Acute on Chronic Systolic Congestive Heart Failure Acute on Chronic Diastolic Congestive Heart Failure Acute on Chronic Systolic & Diastolic  Congestive Heart Failure Other Condition Cannot Clinically Determine  Supporting Information: (As per notes) " shortness of breath likely secondary to mild to moderate CHF"  Reviewed:  no additional documentation provided. He has chronic diastolic CHF.   Thank You,  Alessandra Grout, RN, BSN, [place] Clinical Documentation Specialist: [phone] Cell=[phone] [place]

## 2013-12-12 NOTE — Progress Notes (Addendum)
1115: Pt had episode of large brown liquid emesis with no smell and a few morning pills present. Pt states feeling nauseous and constipated. MD notified and aware of emesis occurrence.  1117: Fleet enema given per MD order. Enema retained in bowel for no more than 10 minutes. No bowel movement occurred. Pt still remains nauseous and dry heaving emesis. Suction set up at bedside.   1120: pt nauseous and having small amounts of emesis. Following episode a subtle gurgling sound in throat heard. Lungs sound clear. Pt placed lying on left side and safety sitter at bedside. MD notified and aware.

## 2013-12-12 NOTE — Significant Event (Signed)
Rapid Response Event Note  Overview: Time Called: 2130 Arrival Time: 2135 Event Type: Other (Comment) (Gi)  Initial Focused Assessment:  Called by staff nurse  Sonia with request for management of pt with new onset vomiting of large amounts coffee ground emesis.  Pt has had enemas today for treatment of constipation with poor results.   On arrival to room pt is sitting in bed actively vomiting coffee ground emesis.  Diaphoretic.  Oriented to person only.  On 2.5 l [place] with O2 sats 96%.  155/90 99 22.  HGB  11.9   Bilateral Breath sounds diminished in bases. No wheezing or rales noted. Abdomen grossly distended.  No bowel sounds auscultated.  Pt denies abdominal pain.  Had received ZOfran 4 mg IV at 2120 hrs.   Interventions:  Dr Maxwell Caul notified, order received for placement of NG tube, and KUB.  While attempting to insert NG tube  Pt became extremely agitated,  Combative and yelling out that he does not want the tube placed.  Attempt to place tube  aborted.  Dr Maxwell Caul notified of same.  KUB to be done in AM.  Hand off report to [facility]   Event Summary: Name of Physician Notified: Dr Nehemiah Settle at 2145    at    Outcome: Stayed in room and stabalized     Amanda Steuart, [place]

## 2013-12-13 ENCOUNTER — Inpatient Hospital Stay (HOSPITAL_COMMUNITY): Payer: Medicare Other

## 2013-12-13 MED ORDER — PREDNISONE 20 MG PO TABS
20.0000 mg | ORAL_TABLET | Freq: Every day | ORAL | Status: DC
  Administered 2013-12-13 – 2013-12-15 (×3): 20 mg via ORAL
  Filled 2013-12-13 (×4): qty 1

## 2013-12-13 NOTE — Care Management Note (Unsigned)
    Page 1 of 1   12/15/2013     9:55:01 AM   CARE MANAGEMENT NOTE 12/15/2013  Patient:  John [name], John [name]   Account Number:  [id number]  Date Initiated:  12/13/2013  Documentation initiated by:  Tomi Bamberger  Subjective/Objective Assessment:   dx pyelonephritis  admit- live alone.     Action/Plan:   pt eval- rec snf.   Anticipated DC Date:  12/14/2013   Anticipated DC Plan:  SKILLED NURSING FACILITY  In-house referral  Clinical Social Worker      DC Planning Services  CM consult      Choice offered to / List presented to:             Status of service:  In process, will continue to follow Medicare Important Message given?   (If response is "NO", the following Medicare IM given date fields will be blank) Date Medicare IM given:   Date Additional Medicare IM given:    Discharge Disposition:    Per UR Regulation:  Reviewed for med. necessity/level of care/duration of stay  If discussed at [place] of Stay Meetings, dates discussed:   12/14/2013    Comments:  12/14/13 Tomi Bamberger RN, BSN [phone] patient with emesis, coffe ground, ng could not be placed. abd distended. kub was negative for ileus and obstruction. vanc started, plan is for snf when stable.  12/13/13 Tomi Bamberger RN, BSN [phone] patient lives alone, per physical therapy recs SNF , CSW referral.

## 2013-12-13 NOTE — Progress Notes (Addendum)
[facility]    Date of Admission:  12/08/2013   Total days of antibiotics 6        Day 2 vanco           ID: John [name] is a 80 y.o. male with MMP presented with back pain, delirium found to have CoNS uti, urinary obstruction, now episode of coffee ground emesis Principal Problem:   Pyelonephritis Active Problems:   Back pain   HTN (hypertension)   CAD (coronary artery disease)   CKD (chronic kidney disease), stage III   A-fib   Shortness of breath   Acute urinary retention   Hearing loss   COPD exacerbation   Unspecified constipation    Subjective: Episode of coffee ground emesis last night. Afebrile. Alert this morning sitting in bed. Still feels poorly  Medications:  . amLODipine  5 mg Oral Daily  . aspirin  325 mg Oral Daily  . atorvastatin  10 mg Oral QPM  . buPROPion  75 mg Oral BID WC  . [START ON 12/14/2013] cyanocobalamin  1,000 mcg Intramuscular Q30 days  . docusate sodium  100 mg Oral BID  . fluticasone  1 puff Inhalation BID  . heparin  5,000 Units Subcutaneous Q8H  . loratadine  10 mg Oral Daily  . memantine  10 mg Oral BID  . metoprolol tartrate  25 mg Oral BID  . multivitamin-lutein  1 capsule Oral Daily  . nystatin   Topical BID  . pantoprazole  40 mg Oral Daily  . predniSONE  20 mg Oral Q breakfast  . sodium chloride  3 mL Intravenous Q12H  . tamsulosin  0.8 mg Oral Daily  . tiotropium  18 mcg Inhalation QPM  . vancomycin  1,000 mg Intravenous Q24H    Objective: Vital signs in last 24 hours: Temp:  [98 F (36.7 C)-98.9 F (37.2 C)] 98.9 F (37.2 C) (01/14 0533) Pulse Rate:  [80-99] 89 (01/14 0533) Resp:  [20-22] 20 (01/14 0533) BP: (130-181)/(69-80) 170/69 mmHg (01/14 0533) SpO2:  [92 %-99 %] 92 % (01/14 0533)  Physical Exam  Constitutional:  oriented to person,.  appears state age. Still mildly confused, but alert. No distress.  HENT:  Mouth/Throat: Oropharynx is clear and moist. No oropharyngeal  exudate.  Cardiovascular: Normal rate, regular rhythm and normal heart sounds. Exam reveals no gallop and no friction rub.  No murmur heard.  Pulmonary/Chest: Effort normal and breath sounds normal. No respiratory distress. He has no wheezes.  Abdominal: Soft. Bowel sounds are normal. He exhibits no distension. There is no tenderness.  Lymphadenopathy: no cervical adenopathy.  Skin: Skin is warm and dry. No rash noted. Scattered echymosis   Microbiology: Urine cx: CoNS - vanco S, nitrofurantoin S, bactrim S, doxy S Studies/Results: Dg Chest Port 1 View  12/12/2013   CLINICAL DATA:  Congestion with possible aspiration  EXAM: PORTABLE CHEST - 1 VIEW  COMPARISON:  Portable chest x-ray of December 09, 2013  FINDINGS: The lungs are reasonably well inflated. There is no focal infiltrate. The cardiac silhouette is normal in size. The mediastinum is normal in width. The knee patient has undergone previous median sternotomy. A permanent pacemaker is in place. The observed portions of the bony thorax exhibit no acute abnormalities. There are degenerative changes of the AC joints bilaterally.  IMPRESSION: There is no evidence of pneumonia nor atelectasis or CHF. No other acute cardiopulmonary abnormality is demonstrated.   Electronically Signed   By: Shanon Brow  [place]   On: 12/12/2013 13:09   Dg Abd 2 Views  12/13/2013   CLINICAL DATA:  Nausea, vomiting, abdominal pain.  EXAM: ABDOMEN - 2 VIEW  COMPARISON:  None.  FINDINGS: There is no evidence for free intraperitoneal air on left lateral decubitus view of the abdomen. Moderate stool is identified within the right colon. No evidence for small bowel obstruction. Degenerative changes are seen in the spine.  IMPRESSION: 1. Nonobstructive bowel gas pattern. 2. Persistent moderate stool burden in the right colon.   Electronically Signed   By: Shon Hale M.D.   On: 12/13/2013 10:07   Dg Abd 2 Views  12/11/2013   CLINICAL DATA:  Abdominal pain and distention  EXAM:  ABDOMEN - 2 VIEW  COMPARISON:  Supine abdominal film of June 22, 2013  FINDINGS: Supine and left-side-down decubitus abdominal films reveal a moderate amount of gas within the colon. There is considerable stool present as well. There is no evidence of a small bowel obstruction. There is gas and stool in the region of the rectum. No free extraluminal gas collections are demonstrated. There are metallic seed implants at the level of the prostatic bed. There are calcifications within the walls of the common femoral arteries.  IMPRESSION: The bowel gas pattern that may reflect an element of constipation. There is no evidence of ileus nor obstruction. No free extraluminal gas collections are demonstrated either.   Electronically Signed   By: David  [place]   On: 12/11/2013 11:50     Assessment/Plan: Complicated uti in male with CoNS = currently on vancomycin. Can switch to oral antibiotics when he can tolerating taking orals/or ready for discharge. Would treat for a total of 10 days. Can switch to bactrim 1 DS bid.  Will sign off  Baxter Flattery [facility] Cell: [phone] Pager: [phone]  12/13/2013, 11:06 AM

## 2013-12-13 NOTE — Progress Notes (Signed)
Assessment/Plan: Principal Problem:   Pyelonephritis - Appreciate Dr. Storm Frisk help. Perhaps with better abx coverage he will improve.  Active Problems:   Back pain   HTN (hypertension)   CAD (coronary artery disease)   CKD (chronic kidney disease), stage III   A-fib   Shortness of breath   Acute urinary retention - Foley in place   Hearing loss   COPD exacerbation - this is not very severe and is mainly being treated with steroids and inhalers. He is now not on an abx that would be appropriate for COPD exacerbation. I was never convinced how much of this was related to that. Lower prednisone dose.    Unspecified constipation - finally had BM and things seemed to settle down. Will recheck KUB today and be sure no obstruction given the N/V he had last night.    Subjective: Overnight had emesis with some coffee grounds. NG could not be placed. However, he had a BM and things seemed to settle down. Less agitation and no vomiting.   Objective:  Vital Signs: Filed Vitals:   12/12/13 1430 12/12/13 2109 12/12/13 2131 12/13/13 0533  BP: 130/80  155/80 170/69  Pulse: 80 90 99 89  Temp: 98 F (36.7 C)  98.1 F (36.7 C) 98.9 F (37.2 C)  TempSrc: Axillary  Oral Oral  Resp: 20 20 22 20   Height:      Weight:      SpO2: 97% 98% 95% 92%     EXAM: Abd: less distended than before.    Intake/Output Summary (Last 24 hours) at 12/13/13 0737 Last data filed at 12/12/13 1852  Gross per 24 hour  Intake   1040 ml  Output    301 ml  Net    739 ml    Lab Results: No results found for this basename: NA, K, CL, CO2, GLUCOSE, BUN, CREATININE, CALCIUM, MG, PHOS,  in the last 72 hours No results found for this basename: AST, ALT, ALKPHOS, BILITOT, PROT, ALBUMIN,  in the last 72 hours No results found for this basename: LIPASE, AMYLASE,  in the last 72 hours No results found for this basename: WBC, NEUTROABS, HGB, HCT, MCV, PLT,  in the last 72 hours No results found for this basename: CKTOTAL,  CKMB, CKMBINDEX, TROPONINI,  in the last 72 hours BNP    Component Value Date/Time   PROBNP 609.3* 12/09/2013 0133   No results found for this basename: DDIMER,  in the last 72 hours No results found for this basename: HGBA1C,  in the last 72 hours No results found for this basename: CHOL, HDL, LDLCALC, TRIG, CHOLHDL, LDLDIRECT,  in the last 72 hours No results found for this basename: TSH, T4TOTAL, FREET3, T3FREE, THYROIDAB,  in the last 72 hours No results found for this basename: VITAMINB12, FOLATE, FERRITIN, TIBC, IRON, RETICCTPCT,  in the last 72 hours  Studies/Results: Dg Chest Port 1 View  12/12/2013   CLINICAL DATA:  Congestion with possible aspiration  EXAM: PORTABLE CHEST - 1 VIEW  COMPARISON:  Portable chest x-ray of December 09, 2013  FINDINGS: The lungs are reasonably well inflated. There is no focal infiltrate. The cardiac silhouette is normal in size. The mediastinum is normal in width. The knee patient has undergone previous median sternotomy. A permanent pacemaker is in place. The observed portions of the bony thorax exhibit no acute abnormalities. There are degenerative changes of the AC joints bilaterally.  IMPRESSION: There is no evidence of pneumonia nor atelectasis or CHF. No other  acute cardiopulmonary abnormality is demonstrated.   Electronically Signed   By: David  [place]   On: 12/12/2013 13:09   Dg Abd 2 Views  12/11/2013   CLINICAL DATA:  Abdominal pain and distention  EXAM: ABDOMEN - 2 VIEW  COMPARISON:  Supine abdominal film of June 22, 2013  FINDINGS: Supine and left-side-down decubitus abdominal films reveal a moderate amount of gas within the colon. There is considerable stool present as well. There is no evidence of a small bowel obstruction. There is gas and stool in the region of the rectum. No free extraluminal gas collections are demonstrated. There are metallic seed implants at the level of the prostatic bed. There are calcifications within the walls of the common  femoral arteries.  IMPRESSION: The bowel gas pattern that may reflect an element of constipation. There is no evidence of ileus nor obstruction. No free extraluminal gas collections are demonstrated either.   Electronically Signed   By: David  [place]   On: 12/11/2013 11:50   Medications: Medications administered in the last 24 hours reviewed.  Current Medication List reviewed.    LOS: 5 days   [facility] @ [facility] [phone]) 12/13/2013, 7:37 AM

## 2013-12-13 NOTE — Progress Notes (Signed)
Physical Therapy Treatment Patient Details Name: John [name] MRN: [id number] DOB: June 02, 1917 Today's Date: 12/13/2013 Time: 9379-0240 PT Time Calculation (min): 28 min  PT Assessment / Plan / Recommendation  History of Present Illness John [name] is a very pleasant elderly male with COPD, CAD, hypertension, hyperlipidemia, cardiovascular disease and claudication who presents to the emergency room with back pain. Pt found to have pyelonephritis.   PT Comments   Pt with abdominal pain and dizziness today which limited mobility during session today. .Continue to recommend SNF for ongoing Physical Therapy.    Follow Up Recommendations  SNF     Does the patient have the potential to tolerate intense rehabilitation     Barriers to Discharge        Equipment Recommendations  None recommended by PT    Recommendations for Other Services    Frequency Min 3X/week   Progress towards PT Goals Progress towards PT goals: Not progressing toward goals - comment (pt in pain and dizzy with today's session)  Plan Current plan remains appropriate    Precautions / Restrictions Precautions Precautions: Fall Restrictions Weight Bearing Restrictions: No   Pertinent Vitals/Pain C/o abdominal pain, did not rate.  Pt c/o dizziness when first sitting up and needed to lay back down for several minutes.      Mobility  Bed Mobility Overal bed mobility: Needs Assistance Bed Mobility: Supine to Sit Supine to sit: Mod assist;HOB elevated General bed mobility comments: Assist to bring trunk up.  Performed several times Transfers Overall transfer level: Needs assistance Equipment used:  (pt held to PT's arms) Transfers: Sit to/from [facility] Sit to Stand: Mod assist Stand pivot transfers: Mod assist General transfer comment: Assist to bring hips up and for balance.  Pt with posterior lean. Ambulation/Gait Ambulation/Gait assistance:  (not tested due to pt not feeling well, but did  agree to OOB)    Exercises     PT Diagnosis:    PT Problem List:   PT Treatment Interventions:     PT Goals (current goals can now be found in the care plan section) Acute Rehab PT Goals PT Goal Formulation: With patient Time For Goal Achievement: 12/18/13 Potential to Achieve Goals: Good  Visit Information  Assistance Needed: +1 History of Present Illness: John [name] is a very pleasant elderly male with COPD, CAD, hypertension, hyperlipidemia, cardiovascular disease and claudication who presents to the emergency room with back pain. Pt found to have pyelonephritis.    Subjective Data      Cognition  Cognition Arousal/Alertness: Awake/alert Behavior During Therapy: WFL for tasks assessed/performed Overall Cognitive Status: No family/caregiver present to determine baseline cognitive functioning (daughter was present at very start of treatment) Memory: Decreased short-term memory    Balance  Balance Overall balance assessment: Needs assistance Sitting-balance support: Feet supported Sitting balance-Leahy Scale: Good Standing balance support: Bilateral upper extremity supported Standing balance comment: pt with heavy posterior lean General Comments General comments (skin integrity, edema, etc.): pt with skin tear on right arm before sesion initiated. RN informed  End of Session PT - End of Session Equipment Utilized During Treatment: Gait belt Activity Tolerance: Patient limited by fatigue;Patient limited by pain Patient left: in chair;with call bell/phone within reach;with chair alarm set Nurse Communication: Mobility status;Other (comment) (skin tear)   GP     John [name] 12/13/2013, 4:26 PM John [name], PT  [phone] 12/13/2013

## 2013-12-13 NOTE — Clinical Social Work Placement (Signed)
Clinical Social Work Department CLINICAL SOCIAL WORK PLACEMENT NOTE 12/13/2013  Patient:  John [name], John [name]  Account Number:  [id number] Admit date:  12/08/2013  Clinical Social Worker:  Kemper Durie, [place]  Date/time:  12/12/2013 03:30 PM  Clinical Social Work is seeking post-discharge placement for this patient at the following level of care:   SKILLED NURSING   (*CSW will update this form in Epic as items are completed)   12/12/2013  Patient/family provided with [place] Department of Clinical Social Work's list of facilities offering this level of care within the geographic area requested by the patient (or if unable, by the patient's family).  12/12/2013  Patient/family informed of their freedom to choose among providers that offer the needed level of care, that participate in Medicare, Medicaid or managed care program needed by the patient, have an available bed and are willing to accept the patient.  12/12/2013  Patient/family informed of [facility]' ownership interest in [facility], as well as of the fact that they are under no obligation to receive care at this facility.  PASARR submitted to EDS on 12/11/2013 PASARR number received from EDS on 12/11/2013  FL2 transmitted to all facilities in geographic area requested by pt/family on  12/12/2013 FL2 transmitted to all facilities within larger geographic area on   Patient informed that his/her managed care company has contracts with or will negotiate with  certain facilities, including the following:     Patient/family informed of bed offers received:  12/13/2013 Patient chooses bed at  Physician recommends and patient chooses bed at    Patient to be transferred to  on   Patient to be transferred to facility by   The following physician request were entered in Epic:   Additional Comments:   Liz Beach, [place], [phone]

## 2013-12-14 LAB — BASIC METABOLIC PANEL
BUN: 38 mg/dL — ABNORMAL HIGH (ref 6–23)
CO2: 26 mEq/L (ref 19–32)
Calcium: 8.6 mg/dL (ref 8.4–10.5)
Chloride: 101 mEq/L (ref 96–112)
Creatinine, Ser: 1.05 mg/dL (ref 0.50–1.35)
GFR calc Af Amer: 68 mL/min — ABNORMAL LOW (ref >90)
GFR calc non Af Amer: 59 mL/min — ABNORMAL LOW (ref >90)
Glucose, Bld: 110 mg/dL — ABNORMAL HIGH (ref 70–99)
Potassium: 4.1 mEq/L (ref 3.7–5.3)
Sodium: 139 mEq/L (ref 137–147)

## 2013-12-14 MED ORDER — POLYETHYLENE GLYCOL 3350 17 G PO PACK
17.0000 g | PACK | Freq: Every day | ORAL | Status: DC
  Administered 2013-12-14 – 2013-12-15 (×2): 17 g via ORAL
  Filled 2013-12-14 (×2): qty 1

## 2013-12-14 MED ORDER — BISACODYL 5 MG PO TBEC
5.0000 mg | DELAYED_RELEASE_TABLET | Freq: Once | ORAL | Status: AC
  Administered 2013-12-14: 5 mg via ORAL
  Filled 2013-12-14: qty 1

## 2013-12-14 NOTE — Clinical Social Work Placement (Signed)
Clinical Social Work Department CLINICAL SOCIAL WORK PLACEMENT NOTE 12/14/2013  Patient:  John [name], John [name]  Account Number:  [id number] Admit date:  12/08/2013  Clinical Social Worker:  Kemper Durie, [place]  Date/time:  12/12/2013 03:30 PM  Clinical Social Work is seeking post-discharge placement for this patient at the following level of care:   SKILLED NURSING   (*CSW will update this form in Epic as items are completed)   12/12/2013  Patient/family provided with [place] Department of Clinical Social Work's list of facilities offering this level of care within the geographic area requested by the patient (or if unable, by the patient's family).  12/12/2013  Patient/family informed of their freedom to choose among providers that offer the needed level of care, that participate in Medicare, Medicaid or managed care program needed by the patient, have an available bed and are willing to accept the patient.  12/12/2013  Patient/family informed of [facility]' ownership interest in [facility], as well as of the fact that they are under no obligation to receive care at this facility.  PASARR submitted to EDS on 12/11/2013 PASARR number received from EDS on 12/11/2013  FL2 transmitted to all facilities in geographic area requested by pt/family on  12/12/2013 FL2 transmitted to all facilities within larger geographic area on   Patient informed that his/her managed care company has contracts with or will negotiate with  certain facilities, including the following:     Patient/family informed of bed offers received:  12/13/2013 Patient chooses bed at [facility] Physician recommends and patient chooses bed at    Patient to be transferred to OTHER on   Patient to be transferred to facility by   The following physician request were entered in Epic:   Additional Comments: Daughter chooses [employer]. Blue Medicare Auth has been obtained. CSW to assist with DC when  stable.   Liz Beach, [place], [place], [phone]

## 2013-12-14 NOTE — Progress Notes (Signed)
Assessment/Plan: Principal Problem:   Pyelonephritis - ID advice noted. He will NOT need IV abx at SNF but we will switch to Septra at DC.  Active Problems:   Back pain   HTN (hypertension)   CAD (coronary artery disease)   CKD (chronic kidney disease), stage III   A-fib   Shortness of breath   Acute urinary retention - Foley in place. He will not be out in time to have followup with Dr. Jasmine December on 1/19. We will notify their office.    Hearing loss   COPD exacerbation - still a lot of upper airway sounds. Lower lung fields are clear.    Unspecified constipation - stool mainly in right colon. Will give dulcolax tab today. Often uses Miralax at home and I will start that regularly here.    Subjective: He is asleep this morning and I can't really awaken him enough to find out how he is doing.   Objective:  Vital Signs: Filed Vitals:   12/13/13 1453 12/13/13 2000 12/13/13 2147 12/14/13 0459  BP: 150/73 154/65 164/75 150/64  Pulse: 86 97 98 91  Temp: 98.1 F (36.7 C) 98.2 F (36.8 C)  98.1 F (36.7 C)  TempSrc: Oral Oral  Oral  Resp: 20 20  20   Height:      Weight:      SpO2: 97% 97%  92%     EXAM: SKIN: decreased redness of buttocks and lower back  LUNGS: upper airway rhonchi. Lower airway sounds fine.    ABD: soft, not very distended   Intake/Output Summary (Last 24 hours) at 12/14/13 0813 Last data filed at 12/14/13 0531  Gross per 24 hour  Intake      0 ml  Output    100 ml  Net   -100 ml    Lab Results:  Recent Labs  12/14/13 0319  NA 139  K 4.1  CL 101  CO2 26  GLUCOSE 110*  BUN 38*  CREATININE 1.05  CALCIUM 8.6   No results found for this basename: AST, ALT, ALKPHOS, BILITOT, PROT, ALBUMIN,  in the last 72 hours No results found for this basename: LIPASE, AMYLASE,  in the last 72 hours No results found for this basename: WBC, NEUTROABS, HGB, HCT, MCV, PLT,  in the last 72 hours No results found for this basename: CKTOTAL, CKMB, CKMBINDEX,  TROPONINI,  in the last 72 hours BNP    Component Value Date/Time   PROBNP 609.3* 12/09/2013 0133   No results found for this basename: DDIMER,  in the last 72 hours No results found for this basename: HGBA1C,  in the last 72 hours No results found for this basename: CHOL, HDL, LDLCALC, TRIG, CHOLHDL, LDLDIRECT,  in the last 72 hours No results found for this basename: TSH, T4TOTAL, FREET3, T3FREE, THYROIDAB,  in the last 72 hours No results found for this basename: VITAMINB12, FOLATE, FERRITIN, TIBC, IRON, RETICCTPCT,  in the last 72 hours  Studies/Results: Dg Chest Port 1 View  12/12/2013   CLINICAL DATA:  Congestion with possible aspiration  EXAM: PORTABLE CHEST - 1 VIEW  COMPARISON:  Portable chest x-ray of December 09, 2013  FINDINGS: The lungs are reasonably well inflated. There is no focal infiltrate. The cardiac silhouette is normal in size. The mediastinum is normal in width. The knee patient has undergone previous median sternotomy. A permanent pacemaker is in place. The observed portions of the bony thorax exhibit no acute abnormalities. There are degenerative changes of the [facility]  joints bilaterally.  IMPRESSION: There is no evidence of pneumonia nor atelectasis or CHF. No other acute cardiopulmonary abnormality is demonstrated.   Electronically Signed   By: David  [place]   On: 12/12/2013 13:09   Dg Abd 2 Views  12/13/2013   CLINICAL DATA:  Nausea, vomiting, abdominal pain.  EXAM: ABDOMEN - 2 VIEW  COMPARISON:  None.  FINDINGS: There is no evidence for free intraperitoneal air on left lateral decubitus view of the abdomen. Moderate stool is identified within the right colon. No evidence for small bowel obstruction. Degenerative changes are seen in the spine.  IMPRESSION: 1. Nonobstructive bowel gas pattern. 2. Persistent moderate stool burden in the right colon.   Electronically Signed   By: Shon Hale M.D.   On: 12/13/2013 10:07   Medications: Medications administered in the last 24 hours  reviewed.  Current Medication List reviewed.    LOS: 6 days   [facility] @ [facility] [phone]) 12/14/2013, 8:13 AM

## 2013-12-15 DIAGNOSIS — B369 Superficial mycosis, unspecified: Secondary | ICD-10-CM | POA: Diagnosis not present

## 2013-12-15 MED ORDER — PREDNISONE 20 MG PO TABS: 40.0000 mg | ORAL_TABLET | Freq: Every day | ORAL | Status: AC

## 2013-12-15 MED ORDER — NYSTATIN 100000 UNIT/GM EX POWD: CUTANEOUS | Status: AC

## 2013-12-15 MED ORDER — DSS 100 MG PO CAPS: 100.0000 mg | ORAL_CAPSULE | Freq: Two times a day (BID) | ORAL | Status: AC

## 2013-12-15 MED ORDER — LEVALBUTEROL HCL 0.63 MG/3ML IN NEBU: 0.6300 mg | INHALATION_SOLUTION | RESPIRATORY_TRACT | Status: AC | PRN

## 2013-12-15 MED ORDER — SULFAMETHOXAZOLE-TMP DS 800-160 MG PO TABS: 1.0000 | ORAL_TABLET | Freq: Two times a day (BID) | ORAL | Status: AC

## 2013-12-15 MED ORDER — BISACODYL 10 MG RE SUPP: 10.0000 mg | Freq: Every day | RECTAL | Status: AC | PRN

## 2013-12-15 NOTE — Progress Notes (Signed)
Physical Therapy Treatment Patient Details Name: John [name] MRN: [id number] DOB: 01/04/1917 Today's Date: 12/15/2013 Time: 8315-1761 PT Time Calculation (min): 18 min  PT Assessment / Plan / Recommendation  History of Present Illness John [name] is a very pleasant elderly male with COPD, CAD, hypertension, hyperlipidemia, cardiovascular disease and claudication who presents to the emergency room with back pain. Pt found to have pyelonephritis.   PT Comments   Patient continues to fatigue easily with mobility and desat during ambulation even with 3L/min supplemental O2 donned (see vitals section of note). This patient will benefit from continued post-acute therapy to improve strength and tolerance for functional activity . Currently recommending skilled nursing facility stay as pt was maximally fatigued after 18 feet of ambulation with RW and 3L/min supplemental O2. +2 assist recommended for safety.   Follow Up Recommendations  SNF     Does the patient have the potential to tolerate intense rehabilitation     Barriers to Discharge        Equipment Recommendations  None recommended by PT    Recommendations for Other Services    Frequency Min 3X/week   Progress towards PT Goals Progress towards PT goals: Progressing toward goals  Plan Current plan remains appropriate    Precautions / Restrictions Precautions Precautions: Fall Restrictions Weight Bearing Restrictions: No   Pertinent Vitals/Pain  Patient Saturations on 2.5L/min supplemental O2 at Rest = 89-90%  Patient Saturations on 3.0L/min supplemental O2 (portable tank) while Ambulating = 78%  Patient Saturations on 3.0L/min supplemental O2 (portable tank) after Ambulation (~2 minutes sitting EOB) = 90-91%    Mobility  Bed Mobility Overal bed mobility: Needs Assistance Bed Mobility: Supine to Sit;Sit to Supine Supine to sit: Min assist;HOB elevated Sit to supine: Mod assist General bed mobility comments: Assist for  trunk elevation when transitioning to full sit on EOB, as well as for LE elevation when performing sit>supine.  Transfers Overall transfer level: Needs assistance Equipment used: Rolling walker (2 wheeled) Transfers: Sit to/from Stand Sit to Stand: Mod assist General transfer comment: Mod assist to come to full stand. Verbal and tactile cueing for hand placement on seated surface.  Ambulation/Gait Ambulation/Gait assistance: Mod assist Ambulation Distance (Feet): 18 Feet Assistive device: Rolling walker (2 wheeled) Gait Pattern/deviations: Step-through pattern;Decreased stride length;Shuffle;Narrow base of support;Trunk flexed Gait velocity: Decreased Gait velocity interpretation: Below normal speed for age/gender General Gait Details: Verbal and tactile cueing for walker placement, especially during turns. Pt often stepped outside of his walker area and required assist to reposition walker appropriately.     Exercises     PT Diagnosis:    PT Problem List:   PT Treatment Interventions:     PT Goals (current goals can now be found in the care plan section) Acute Rehab PT Goals Patient Stated Goal: Pt didn't state. PT Goal Formulation: With patient Time For Goal Achievement: 12/18/13 Potential to Achieve Goals: Good  Visit Information  Last PT Received On: 12/15/13 Assistance Needed: +2 History of Present Illness: John [name] is a very pleasant elderly male with COPD, CAD, hypertension, hyperlipidemia, cardiovascular disease and claudication who presents to the emergency room with back pain. Pt found to have pyelonephritis.    Subjective Data  Subjective: "I'm sorry, I just can't do anymore right now." Patient Stated Goal: Pt didn't state.   Cognition  Cognition Arousal/Alertness: Awake/alert Behavior During Therapy: WFL for tasks assessed/performed Overall Cognitive Status: No family/caregiver present to determine baseline cognitive functioning Memory: Decreased short-term  memory  Balance  Balance Overall balance assessment: Needs assistance Sitting-balance support: Feet supported;Bilateral upper extremity supported Sitting balance-Leahy Scale: Good Standing balance support: Bilateral upper extremity supported Standing balance-Leahy Scale: Poor  End of Session PT - End of Session Equipment Utilized During Treatment: Gait belt Activity Tolerance: Patient limited by fatigue Patient left: in bed;with call bell/phone within reach;with nursing/sitter in room Nurse Communication: Mobility status;Other (comment) (O2 saturation)   GP     Jolyn Lent 12/15/2013, 2:19 PM  Jolyn Lent, PT, DPT [phone]

## 2013-12-15 NOTE — Discharge Summary (Signed)
Physician Discharge Summary  NAME:John [name]  XHB:[id number]  DOB: [birth year]  2 Admit date: 12/08/2013 Discharge date: 12/15/2013  Admitting Diagnosis: acute urinary retention  Discharge Diagnoses:  Active Hospital Problems   Diagnosis Date Noted  . Pyelonephritis due to coag neg staph 12/08/2013  . COPD exacerbation 12/10/2013  . Unspecified constipation 12/10/2013  . Shortness of breath 12/09/2013  . Acute urinary retention requiring Foley catheter.  12/09/2013  . Hearing loss 12/09/2013  . Back pain 12/08/2013  . HTN (hypertension) 12/08/2013  . CAD (coronary artery disease) 12/08/2013  . CKD (chronic kidney disease), stage III 12/08/2013  . A-fib 12/08/2013    Resolved Hospital Problems   Diagnosis Date Noted Date Resolved  No resolved problems to display.    Things to follow up in the outpatient setting: see below about appt with Dr. Jasmine December. Continue Xopenex regularly for 3-4 days and then do prn. Prednisone should be at 40 mg daily for one week and then 20 mg daily. Septra should be continued for a week.   Hospital Course: Patient was admitted with acute urinary retention. He had evidence of a UTI, and his culture grew out coag negative staph. For the first several days, however, before his cultures were back, he was on ceftriaxone. ID consult was obtained after cultures were back and he was changed to vancomycin. At d/c this is being changed to Septra and should be continued for one week.   The patient suffers from chronic constipation and uses Miralax at home. This was not continued and he developed more serious issues with constipation, abdominal distension, and even nausea and vomiting. Suppositories and a Fleets enema did not help very much. Finally, after Dulcolax tabs and resumption of Miralax, he was able to have a couple of bowel movements and his abdominal distension improved.   He had some skin changes c/w fungal dermatitis and was started on nystatin topical  powder. This worked well.   He had a moderate COPD exacerbation as well and was treated with prednisone and nebulizers. This improved but he still needs oxygen, prednisone, and nebulizers. See other instructions.   Discharge Condition: improved  Consults: ID  Disposition: SNF  Discharge Orders   Future Appointments Provider Department Dept Phone   05/01/2014 10:00 AM Sinclair Grooms, MD [facility] [phone]   Future Orders Complete By Expires   Diet - low sodium heart healthy  As directed    Increase activity slowly  As directed    Comments:     Follow PT instructions to improve ambulation back to baseline status       Medication List    STOP taking these medications       triamcinolone cream 0.1 %  Commonly known as:  KENALOG      TAKE these medications       acetaminophen 500 MG tablet  Commonly known as:  TYLENOL  Take 1,000 mg by mouth 2 (two) times daily as needed for mild pain.     amLODipine 5 MG tablet  Commonly known as:  NORVASC  Take 5 mg by mouth daily.     aspirin 325 MG EC tablet  Take 325 mg by mouth daily.     atorvastatin 10 MG tablet  Commonly known as:  LIPITOR  Take 10 mg by mouth every evening.     beclomethasone 40 MCG/ACT inhaler  Commonly known as:  QVAR  Inhale 2 puffs into the lungs every evening.  bisacodyl 10 MG suppository  Commonly known as:  DULCOLAX  Place 1 suppository (10 mg total) rectally daily as needed for moderate constipation.     buPROPion 75 MG tablet  Commonly known as:  WELLBUTRIN  Take 75 mg by mouth 2 (two) times daily.     Cyanocobalamin 1000 MCG/ML Kit  Inject 1 vial as directed every 30 (thirty) days.     DSS 100 MG Caps  Take 100 mg by mouth 2 (two) times daily.     esomeprazole 40 MG capsule  Commonly known as:  NEXIUM  Take 40 mg by mouth daily before breakfast.     levalbuterol 0.63 MG/3ML nebulizer solution  Commonly known as:  XOPENEX  Take 3 mLs (0.63 mg total) by  nebulization every 2 (two) hours as needed for wheezing or shortness of breath.     loratadine 10 MG tablet  Commonly known as:  CLARITIN  Take 10 mg by mouth every morning.     metoprolol tartrate 25 MG tablet  Commonly known as:  LOPRESSOR  Take 25 mg by mouth 2 (two) times daily.     NAMENDA XR 28 MG Cp24  Generic drug:  Memantine HCl ER  Take 28 mg by mouth every morning.     nystatin 100000 UNIT/GM Powd  Apply sparingly to lower back and upper buttocks daily until rash is resolved     polyethylene glycol packet  Commonly known as:  MIRALAX / GLYCOLAX  Take 17 g by mouth daily as needed for mild constipation.     predniSONE 20 MG tablet  Commonly known as:  DELTASONE  Take 2 tablets (40 mg total) by mouth daily with breakfast. After one week, lower to 20 mg daily and take 20 mg daily for one week and stop     PRESERVISION AREDS 2 PO  Take 1 tablet by mouth 2 (two) times daily.     sulfamethoxazole-trimethoprim 800-160 MG per tablet  Commonly known as:  BACTRIM DS  Take 1 tablet by mouth 2 (two) times daily.     tiotropium 18 MCG inhalation capsule  Commonly known as:  SPIRIVA  Place 18 mcg into inhaler and inhale every evening.           Follow-up Information   Follow up with Molli Hazard, MD On 12/18/2013. (8:15)    Specialty:  Urology   Contact information:   [facility]  [place] [address] [phone]       Time coordinating discharge: 25 minutes including medication reconciliation,  preparation of discharge papers, and discussion with patient  and family    Signed: Horton Finer 12/15/2013, 11:07 AM

## 2013-12-15 NOTE — Clinical Social Work Note (Signed)
CSW called [facility] for transportation.

## 2013-12-15 NOTE — Clinical Social Work Note (Signed)
CSW contacted daughter, Vaughan Basta to advise of pt transfer to [facility] today.  Linda agreeable with disposition.  Pt will be transferred via [facility].  RN given report #.  Pt name was entered incorrectly into [facility].  CSW contacted [facility] and faxed correct name information to [place].  CSW will update SNF once name corrected.  Nonnie Done, [place] [phone]  Clinical Social Work

## 2013-12-31 DEATH — deceased

## 2014-01-30 ENCOUNTER — Telehealth: Payer: Self-pay

## 2014-01-30 NOTE — Telephone Encounter (Signed)
Patient past away @ [facility] per Iver Nestle in [place]

## 2014-05-01 ENCOUNTER — Ambulatory Visit: Payer: Medicare Other | Admitting: Interventional Cardiology

## 2014-06-02 IMAGING — CR DG ABDOMEN 2V
2 series · 2 of 2 positions shown · non-contrast
Comparison: None.

CLINICAL DATA: Nausea, vomiting, abdominal pain.

EXAM:
ABDOMEN - 2 VIEW

[w abdomen decub]
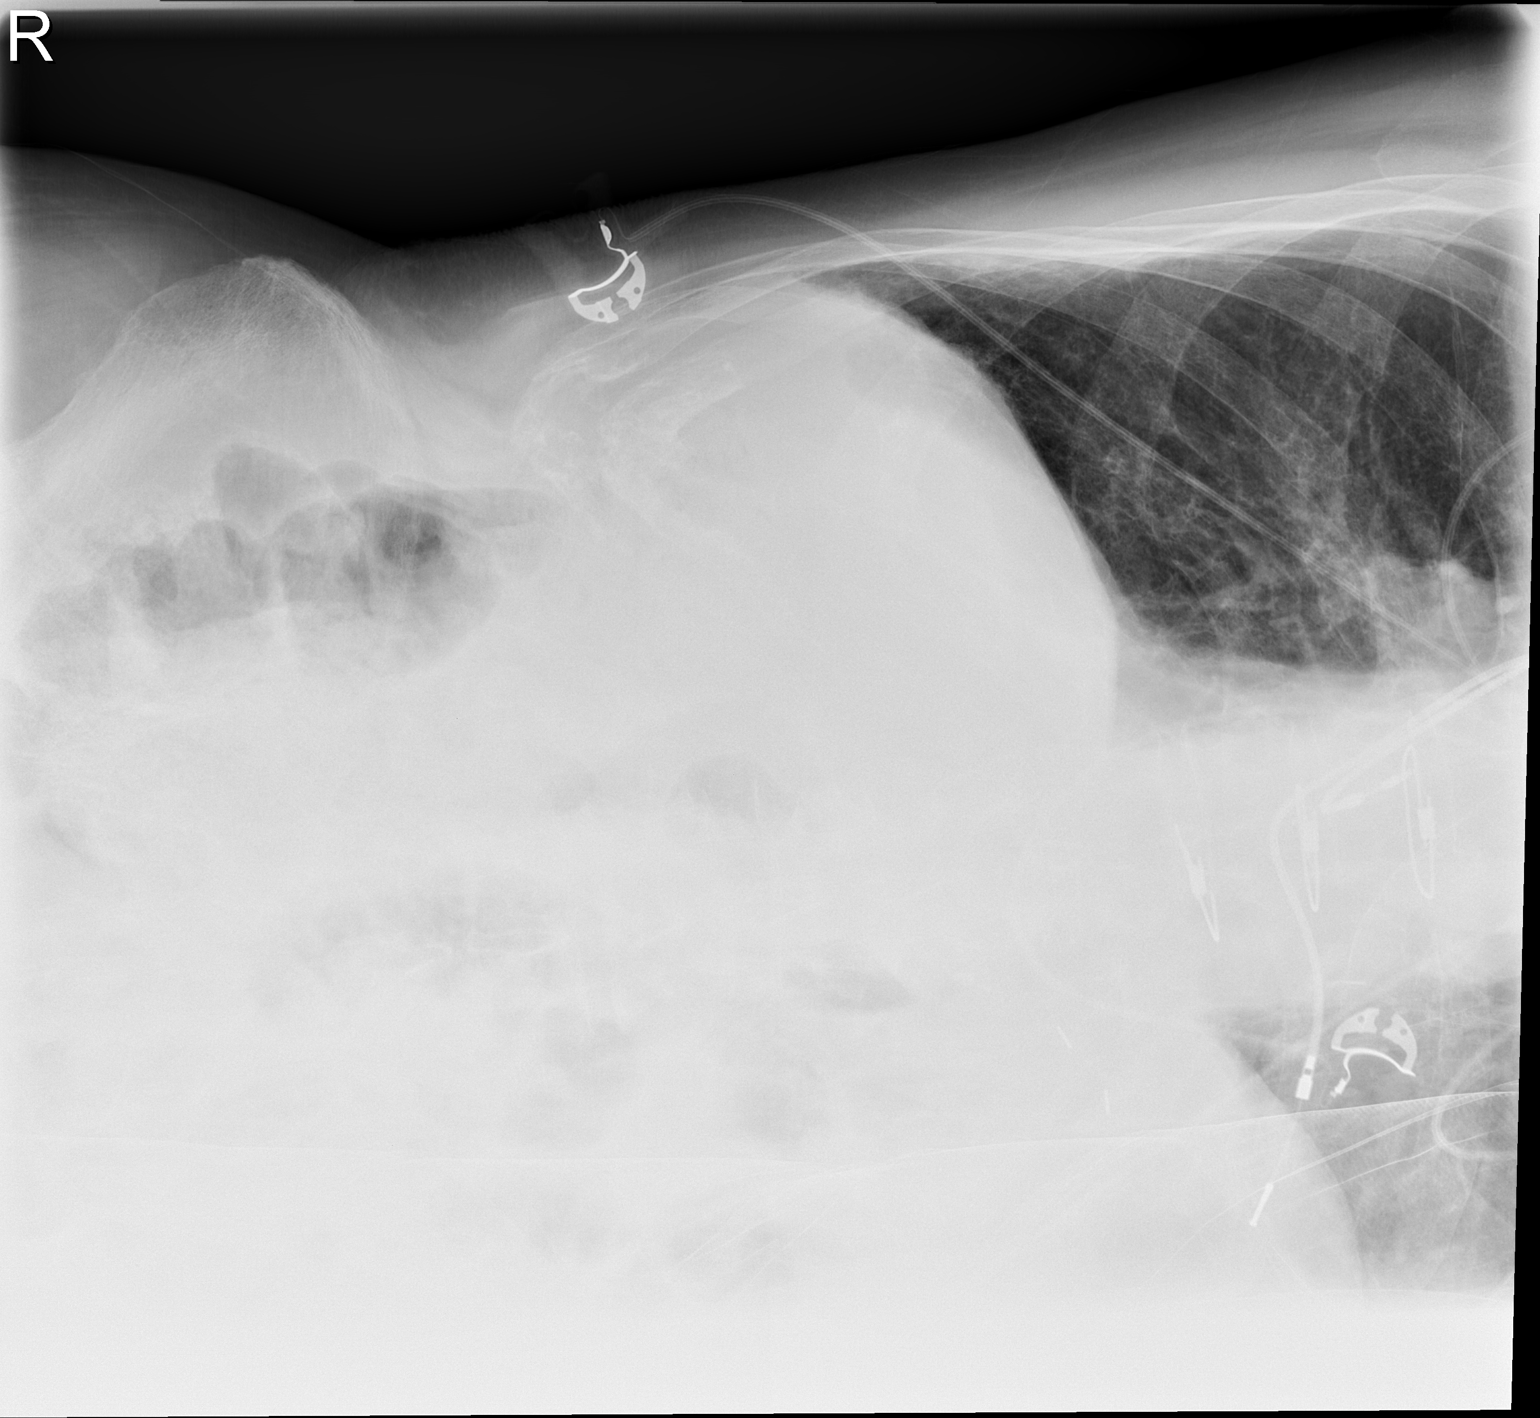

[t abdomen supine]
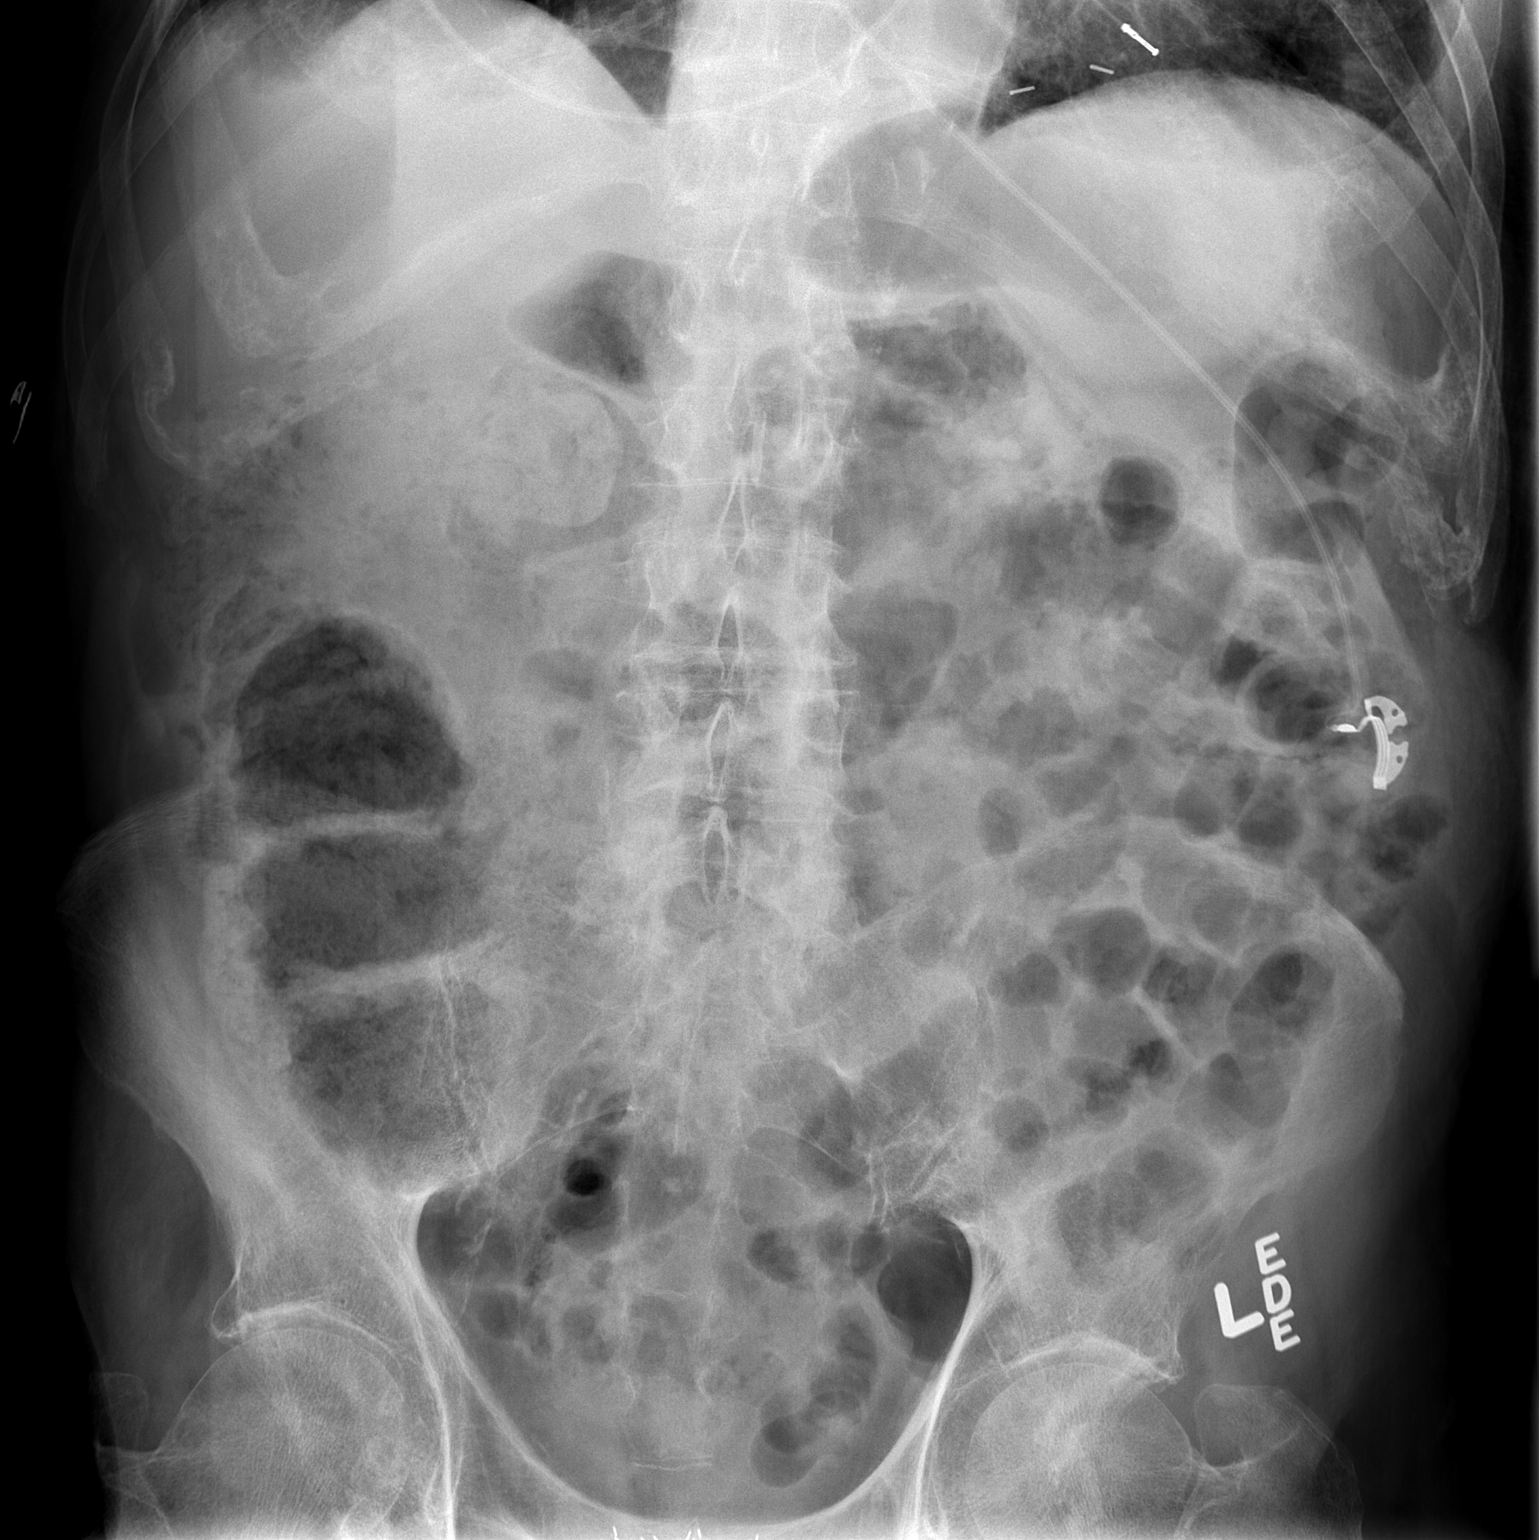

[2 of 2 positions shown; findings below may reference images not displayed]

FINDINGS: There is no evidence for free intraperitoneal air on left lateral
decubitus view of the abdomen. Moderate stool is identified within
the right colon. No evidence for small bowel obstruction.
Degenerative changes are seen in the spine.
IMPRESSION: 1. Nonobstructive bowel gas pattern.
2. Persistent moderate stool burden in the right colon.
